# Patient Record
Sex: Female | Born: 1985 | Race: White | Hispanic: Yes | Marital: Married | Smoking: Former smoker
Health system: Southern US, Community
[De-identification: ages and names within clinical notes are randomized; demographics above are authoritative.]

## PROBLEM LIST (undated history)

## (undated) DIAGNOSIS — Z89511 Acquired absence of right leg below knee: Secondary | ICD-10-CM

## (undated) DIAGNOSIS — M869 Osteomyelitis, unspecified: Secondary | ICD-10-CM

## (undated) DIAGNOSIS — D509 Iron deficiency anemia, unspecified: Secondary | ICD-10-CM

## (undated) DIAGNOSIS — Z89512 Acquired absence of left leg below knee: Secondary | ICD-10-CM

## (undated) DIAGNOSIS — Z9889 Other specified postprocedural states: Secondary | ICD-10-CM

## (undated) DIAGNOSIS — E538 Deficiency of other specified B group vitamins: Secondary | ICD-10-CM

## (undated) DIAGNOSIS — Z899 Acquired absence of limb, unspecified: Secondary | ICD-10-CM

## (undated) HISTORY — PX: FREE FLAP GRAFT: SHX1677

## (undated) HISTORY — PX: OTHER SURGICAL HISTORY: SHX169

## (undated) HISTORY — PX: BACK SURGERY: SHX140

---

## 2012-10-22 DIAGNOSIS — B999 Unspecified infectious disease: Secondary | ICD-10-CM | POA: Insufficient documentation

## 2014-07-16 DIAGNOSIS — A4901 Methicillin susceptible Staphylococcus aureus infection, unspecified site: Secondary | ICD-10-CM | POA: Insufficient documentation

## 2014-07-20 DIAGNOSIS — R2689 Other abnormalities of gait and mobility: Secondary | ICD-10-CM | POA: Insufficient documentation

## 2014-09-29 DIAGNOSIS — G8921 Chronic pain due to trauma: Secondary | ICD-10-CM | POA: Insufficient documentation

## 2015-11-02 DIAGNOSIS — F329 Major depressive disorder, single episode, unspecified: Secondary | ICD-10-CM | POA: Insufficient documentation

## 2015-11-02 DIAGNOSIS — G8220 Paraplegia, unspecified: Secondary | ICD-10-CM | POA: Insufficient documentation

## 2015-11-02 DIAGNOSIS — F419 Anxiety disorder, unspecified: Secondary | ICD-10-CM | POA: Insufficient documentation

## 2015-11-02 DIAGNOSIS — F32A Depression, unspecified: Secondary | ICD-10-CM | POA: Insufficient documentation

## 2016-04-17 DIAGNOSIS — Z8739 Personal history of other diseases of the musculoskeletal system and connective tissue: Secondary | ICD-10-CM | POA: Insufficient documentation

## 2016-05-10 DIAGNOSIS — N319 Neuromuscular dysfunction of bladder, unspecified: Secondary | ICD-10-CM | POA: Insufficient documentation

## 2016-08-02 ENCOUNTER — Emergency Department (HOSPITAL_COMMUNITY): Payer: Medicare Other

## 2016-08-02 ENCOUNTER — Inpatient Hospital Stay (HOSPITAL_COMMUNITY)
Admission: EM | Admit: 2016-08-02 | Discharge: 2016-08-05 | DRG: 539 | Disposition: A | Discharge status: Home Health | Payer: Medicare Other | Attending: Family Medicine | Admitting: Family Medicine

## 2016-08-02 ENCOUNTER — Encounter (HOSPITAL_COMMUNITY): Payer: Self-pay

## 2016-08-02 ENCOUNTER — Inpatient Hospital Stay (HOSPITAL_COMMUNITY): Payer: Medicare Other

## 2016-08-02 DIAGNOSIS — M8610 Other acute osteomyelitis, unspecified site: Secondary | ICD-10-CM | POA: Diagnosis not present

## 2016-08-02 DIAGNOSIS — D500 Iron deficiency anemia secondary to blood loss (chronic): Secondary | ICD-10-CM | POA: Diagnosis present

## 2016-08-02 DIAGNOSIS — Z888 Allergy status to other drugs, medicaments and biological substances status: Secondary | ICD-10-CM

## 2016-08-02 DIAGNOSIS — L89220 Pressure ulcer of left hip, unstageable: Secondary | ICD-10-CM | POA: Diagnosis not present

## 2016-08-02 DIAGNOSIS — L89224 Pressure ulcer of left hip, stage 4: Secondary | ICD-10-CM | POA: Diagnosis present

## 2016-08-02 DIAGNOSIS — N390 Urinary tract infection, site not specified: Secondary | ICD-10-CM | POA: Diagnosis present

## 2016-08-02 DIAGNOSIS — Z886 Allergy status to analgesic agent status: Secondary | ICD-10-CM | POA: Diagnosis not present

## 2016-08-02 DIAGNOSIS — L89324 Pressure ulcer of left buttock, stage 4: Secondary | ICD-10-CM | POA: Diagnosis present

## 2016-08-02 DIAGNOSIS — Z89512 Acquired absence of left leg below knee: Secondary | ICD-10-CM

## 2016-08-02 DIAGNOSIS — E860 Dehydration: Secondary | ICD-10-CM

## 2016-08-02 DIAGNOSIS — Z833 Family history of diabetes mellitus: Secondary | ICD-10-CM | POA: Diagnosis not present

## 2016-08-02 DIAGNOSIS — R000 Tachycardia, unspecified: Secondary | ICD-10-CM

## 2016-08-02 DIAGNOSIS — Z9889 Other specified postprocedural states: Secondary | ICD-10-CM | POA: Diagnosis not present

## 2016-08-02 DIAGNOSIS — M86652 Other chronic osteomyelitis, left thigh: Secondary | ICD-10-CM | POA: Diagnosis present

## 2016-08-02 DIAGNOSIS — M868X8 Other osteomyelitis, other site: Secondary | ICD-10-CM | POA: Diagnosis not present

## 2016-08-02 DIAGNOSIS — N319 Neuromuscular dysfunction of bladder, unspecified: Secondary | ICD-10-CM | POA: Diagnosis present

## 2016-08-02 DIAGNOSIS — G8220 Paraplegia, unspecified: Secondary | ICD-10-CM | POA: Diagnosis present

## 2016-08-02 DIAGNOSIS — L8990 Pressure ulcer of unspecified site, unspecified stage: Secondary | ICD-10-CM | POA: Insufficient documentation

## 2016-08-02 DIAGNOSIS — M869 Osteomyelitis, unspecified: Secondary | ICD-10-CM

## 2016-08-02 DIAGNOSIS — Z87891 Personal history of nicotine dependence: Secondary | ICD-10-CM | POA: Diagnosis not present

## 2016-08-02 DIAGNOSIS — Z880 Allergy status to penicillin: Secondary | ICD-10-CM

## 2016-08-02 DIAGNOSIS — R112 Nausea with vomiting, unspecified: Secondary | ICD-10-CM

## 2016-08-02 DIAGNOSIS — L89894 Pressure ulcer of other site, stage 4: Secondary | ICD-10-CM | POA: Diagnosis not present

## 2016-08-02 DIAGNOSIS — Z89511 Acquired absence of right leg below knee: Secondary | ICD-10-CM | POA: Diagnosis not present

## 2016-08-02 DIAGNOSIS — M8668 Other chronic osteomyelitis, other site: Secondary | ICD-10-CM | POA: Diagnosis present

## 2016-08-02 DIAGNOSIS — L8944 Pressure ulcer of contiguous site of back, buttock and hip, stage 4: Secondary | ICD-10-CM

## 2016-08-02 DIAGNOSIS — M8648 Chronic osteomyelitis with draining sinus, other site: Secondary | ICD-10-CM | POA: Diagnosis not present

## 2016-08-02 HISTORY — DX: Other specified postprocedural states: Z98.890

## 2016-08-02 HISTORY — DX: Acquired absence of limb, unspecified: Z89.9

## 2016-08-02 HISTORY — DX: Osteomyelitis, unspecified: M86.9

## 2016-08-02 LAB — PREGNANCY, URINE: Preg Test, Ur: NEGATIVE

## 2016-08-02 LAB — I-STAT CG4 LACTIC ACID, ED
Lactic Acid, Venous: 1.05 mmol/L (ref 0.5–1.9)
Lactic Acid, Venous: 1.11 mmol/L (ref 0.5–1.9)

## 2016-08-02 LAB — CBC WITH DIFFERENTIAL/PLATELET
BASOS ABS: 0 10*3/uL (ref 0.0–0.1)
BASOS PCT: 0 %
EOS ABS: 0.1 10*3/uL (ref 0.0–0.7)
Eosinophils Relative: 1 %
HEMATOCRIT: 32.1 % — AB (ref 36.0–46.0)
Hemoglobin: 9.5 g/dL — ABNORMAL LOW (ref 12.0–15.0)
Lymphocytes Relative: 29 %
Lymphs Abs: 3 10*3/uL (ref 0.7–4.0)
MCH: 22.5 pg — ABNORMAL LOW (ref 26.0–34.0)
MCHC: 29.6 g/dL — ABNORMAL LOW (ref 30.0–36.0)
MCV: 75.9 fL — ABNORMAL LOW (ref 78.0–100.0)
MONO ABS: 0.9 10*3/uL (ref 0.1–1.0)
Monocytes Relative: 9 %
NEUTROS ABS: 6.4 10*3/uL (ref 1.7–7.7)
Neutrophils Relative %: 61 %
PLATELETS: 530 10*3/uL — AB (ref 150–400)
RBC: 4.23 MIL/uL (ref 3.87–5.11)
RDW: 19.5 % — AB (ref 11.5–15.5)
WBC: 10.4 10*3/uL (ref 4.0–10.5)

## 2016-08-02 LAB — URINALYSIS, ROUTINE W REFLEX MICROSCOPIC
BILIRUBIN URINE: NEGATIVE
GLUCOSE, UA: NEGATIVE mg/dL
Ketones, ur: 80 mg/dL — AB
NITRITE: POSITIVE — AB
PH: 7 (ref 5.0–8.0)
Protein, ur: 30 mg/dL — AB
SPECIFIC GRAVITY, URINE: 1.017 (ref 1.005–1.030)

## 2016-08-02 LAB — COMPREHENSIVE METABOLIC PANEL
ALK PHOS: 82 U/L (ref 38–126)
ALT: 8 U/L — ABNORMAL LOW (ref 14–54)
ANION GAP: 12 (ref 5–15)
AST: 11 U/L — ABNORMAL LOW (ref 15–41)
Albumin: 3.4 g/dL — ABNORMAL LOW (ref 3.5–5.0)
BILIRUBIN TOTAL: 0.6 mg/dL (ref 0.3–1.2)
BUN: 7 mg/dL (ref 6–20)
CALCIUM: 8.8 mg/dL — AB (ref 8.9–10.3)
CO2: 21 mmol/L — ABNORMAL LOW (ref 22–32)
Chloride: 101 mmol/L (ref 101–111)
Creatinine, Ser: <0.3 mg/dL — ABNORMAL LOW (ref 0.44–1.00)
GLUCOSE: 80 mg/dL (ref 65–99)
Potassium: 3.6 mmol/L (ref 3.5–5.1)
Sodium: 134 mmol/L — ABNORMAL LOW (ref 135–145)
Total Protein: 8.2 g/dL — ABNORMAL HIGH (ref 6.5–8.1)

## 2016-08-02 MED ORDER — MORPHINE SULFATE (PF) 4 MG/ML IV SOLN
4.0000 mg | Freq: Once | INTRAVENOUS | Status: AC
  Administered 2016-08-02: 4 mg via INTRAVENOUS
  Filled 2016-08-02: qty 1

## 2016-08-02 MED ORDER — SODIUM CHLORIDE 0.9% FLUSH: 10.0000 mL | INTRAVENOUS | Status: DC | PRN

## 2016-08-02 MED ORDER — VANCOMYCIN HCL IN DEXTROSE 1-5 GM/200ML-% IV SOLN
1000.0000 mg | Freq: Once | INTRAVENOUS | Status: AC
  Administered 2016-08-02: 1000 mg via INTRAVENOUS
  Filled 2016-08-02: qty 200

## 2016-08-02 MED ORDER — SODIUM CHLORIDE 0.9 % IV BOLUS (SEPSIS)
1000.0000 mL | Freq: Once | INTRAVENOUS | Status: AC
  Administered 2016-08-02: 1000 mL via INTRAVENOUS

## 2016-08-02 MED ORDER — ONDANSETRON HCL 4 MG/2ML IJ SOLN
4.0000 mg | Freq: Once | INTRAMUSCULAR | Status: AC
  Administered 2016-08-02: 4 mg via INTRAVENOUS
  Filled 2016-08-02: qty 2

## 2016-08-02 MED ORDER — ACETAMINOPHEN 325 MG PO TABS: 650.0000 mg | ORAL_TABLET | Freq: Four times a day (QID) | ORAL | Status: DC | PRN

## 2016-08-02 MED ORDER — ACETAMINOPHEN 650 MG RE SUPP: 650.0000 mg | Freq: Four times a day (QID) | RECTAL | Status: DC | PRN

## 2016-08-02 MED ORDER — SODIUM CHLORIDE 0.9% FLUSH
10.0000 mL | Freq: Two times a day (BID) | INTRAVENOUS | Status: DC
  Administered 2016-08-02 – 2016-08-05 (×3): 10 mL

## 2016-08-02 MED ORDER — MORPHINE SULFATE (PF) 2 MG/ML IV SOLN
2.0000 mg | INTRAVENOUS | Status: DC | PRN
  Administered 2016-08-02 – 2016-08-05 (×12): 2 mg via INTRAVENOUS
  Filled 2016-08-02 (×13): qty 1

## 2016-08-02 MED ORDER — HYDROMORPHONE HCL 1 MG/ML IJ SOLN
1.0000 mg | Freq: Once | INTRAMUSCULAR | Status: AC
  Administered 2016-08-02: 1 mg via INTRAVENOUS
  Filled 2016-08-02: qty 1

## 2016-08-02 MED ORDER — ONDANSETRON HCL 4 MG/2ML IJ SOLN
4.0000 mg | Freq: Four times a day (QID) | INTRAMUSCULAR | Status: DC | PRN
  Administered 2016-08-02 – 2016-08-03 (×2): 4 mg via INTRAVENOUS
  Filled 2016-08-02 (×2): qty 2

## 2016-08-02 MED ORDER — HEPARIN SODIUM (PORCINE) 5000 UNIT/ML IJ SOLN
5000.0000 [IU] | Freq: Three times a day (TID) | INTRAMUSCULAR | Status: DC
  Administered 2016-08-02 – 2016-08-04 (×5): 5000 [IU] via SUBCUTANEOUS
  Filled 2016-08-02 (×7): qty 1

## 2016-08-02 MED ORDER — MORPHINE SULFATE (PF) 2 MG/ML IV SOLN: 1.0000 mg | INTRAVENOUS | Status: DC | PRN

## 2016-08-02 MED ORDER — DEXTROSE 5 % IV SOLN
1.0000 g | Freq: Once | INTRAVENOUS | Status: AC
  Administered 2016-08-02: 1 g via INTRAVENOUS
  Filled 2016-08-02: qty 10

## 2016-08-02 MED ORDER — DEXTROSE 5 % IV SOLN
2.0000 g | INTRAVENOUS | Status: DC
  Administered 2016-08-03 – 2016-08-05 (×3): 2 g via INTRAVENOUS
  Filled 2016-08-02 (×3): qty 2

## 2016-08-02 NOTE — Progress Notes (Signed)
pcp is [facility] [address] [place] [phone]

## 2016-08-02 NOTE — ED Notes (Signed)
Call to 5E, await for them to be ready for bed
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
## 2016-08-02 NOTE — ED Notes (Signed)
IV team at the bedside. 

## 2016-08-02 NOTE — ED Triage Notes (Signed)
Pt recently moved here. Does not have a primary MD.  Has infected decub wound.  Fever.  Odor.   Pt also bil amputee
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
## 2016-08-02 NOTE — ED Notes (Signed)
Pt self caths for urine, will give her supplies once fluid bolus is in as pt feels dehydrated
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
## 2016-08-02 NOTE — Progress Notes (Signed)
Pharmacy Antibiotic Note  Kathryn Ewing is a 30 y.o. female admitted on 08/02/2016 with osteomyelitis.  Pharmacy has been consulted for Rocephin dosing.  Plan: 1) Rocephin 2g IV daily 2) Will continue to monitor with you  Weight: 145 lb (65.8 kg)  Temp (24hrs), Avg:98.5 F (36.9 C), Min:98.2 F (36.8 C), Max:99.1 F (37.3 C)   Recent Labs Lab 08/02/16 1143 08/02/16 1154 08/02/16 1452  WBC 10.4  --   --   CREATININE <0.30*  --   --   LATICACIDVEN  --  1.05 1.11    CrCl cannot be calculated (This lab value cannot be used to calculate CrCl because it is not a number: <0.30).    Allergies  Allergen Reactions  . Amoxicillin Nausea And Vomiting and Other (See Comments)    Has patient had a PCN reaction causing immediate rash, facial/tongue/throat swelling, SOB or lightheadedness with hypotension: No Has patient had a PCN reaction causing severe rash involving mucus membranes or skin necrosis: No Has patient had a PCN reaction that required hospitalization No Has patient had a PCN reaction occurring within the last 10 years: Yes If all of the above answers are "NO", then may proceed with Cephalosporin use.  . Codeine Nausea And Vomiting  . Hydrocodone Nausea And Vomiting  . Meropenem Nausea And Vomiting  . Metronidazole Nausea And Vomiting  . Oxycodone-Acetaminophen Itching  . Piperacillin Sod-Tazobactam So Nausea And Vomiting  . Promethazine Nausea And Vomiting  . Diazepam Rash     Thank you for allowing pharmacy to be a part of this patient's care.   Hessie KnowsJustin M Makhai Fulco, PharmD, BCPS Pager [phone] 08/02/2016 6:13 PM

## 2016-08-02 NOTE — ED Notes (Signed)
ABD pad placed on bed sore

## 2016-08-03 ENCOUNTER — Inpatient Hospital Stay (HOSPITAL_COMMUNITY): Payer: Medicare Other

## 2016-08-03 DIAGNOSIS — Z888 Allergy status to other drugs, medicaments and biological substances status: Secondary | ICD-10-CM

## 2016-08-03 DIAGNOSIS — M868X8 Other osteomyelitis, other site: Secondary | ICD-10-CM

## 2016-08-03 DIAGNOSIS — L89894 Pressure ulcer of other site, stage 4: Secondary | ICD-10-CM

## 2016-08-03 DIAGNOSIS — Z79899 Other long term (current) drug therapy: Secondary | ICD-10-CM

## 2016-08-03 DIAGNOSIS — N390 Urinary tract infection, site not specified: Secondary | ICD-10-CM

## 2016-08-03 DIAGNOSIS — L89220 Pressure ulcer of left hip, unstageable: Secondary | ICD-10-CM

## 2016-08-03 DIAGNOSIS — M8648 Chronic osteomyelitis with draining sinus, other site: Secondary | ICD-10-CM

## 2016-08-03 DIAGNOSIS — R000 Tachycardia, unspecified: Secondary | ICD-10-CM

## 2016-08-03 DIAGNOSIS — Z87891 Personal history of nicotine dependence: Secondary | ICD-10-CM

## 2016-08-03 LAB — CBC
HCT: 28 % — ABNORMAL LOW (ref 36.0–46.0)
Hemoglobin: 8.2 g/dL — ABNORMAL LOW (ref 12.0–15.0)
MCH: 22.6 pg — ABNORMAL LOW (ref 26.0–34.0)
MCHC: 29.3 g/dL — ABNORMAL LOW (ref 30.0–36.0)
MCV: 77.1 fL — ABNORMAL LOW (ref 78.0–100.0)
PLATELETS: 459 10*3/uL — AB (ref 150–400)
RBC: 3.63 MIL/uL — AB (ref 3.87–5.11)
RDW: 19.4 % — AB (ref 11.5–15.5)
WBC: 8 10*3/uL (ref 4.0–10.5)

## 2016-08-03 LAB — BASIC METABOLIC PANEL
ANION GAP: 10 (ref 5–15)
CALCIUM: 8.1 mg/dL — AB (ref 8.9–10.3)
CO2: 22 mmol/L (ref 22–32)
Chloride: 103 mmol/L (ref 101–111)
Creatinine, Ser: 0.34 mg/dL — ABNORMAL LOW (ref 0.44–1.00)
GFR calc Af Amer: >60 mL/min (ref >60)
Glucose, Bld: 83 mg/dL (ref 65–99)
POTASSIUM: 3.2 mmol/L — AB (ref 3.5–5.1)
SODIUM: 135 mmol/L (ref 135–145)

## 2016-08-03 LAB — C-REACTIVE PROTEIN: CRP: 18.7 mg/dL — AB (ref <1.0)

## 2016-08-03 MED ORDER — SODIUM CHLORIDE 0.9 % IV SOLN
30.0000 meq | Freq: Once | INTRAVENOUS | Status: AC
  Administered 2016-08-03: 30 meq via INTRAVENOUS
  Filled 2016-08-03: qty 15

## 2016-08-03 MED ORDER — POTASSIUM CHLORIDE CRYS ER 20 MEQ PO TBCR
40.0000 meq | EXTENDED_RELEASE_TABLET | Freq: Once | ORAL | Status: DC
  Filled 2016-08-03: qty 2

## 2016-08-03 MED ORDER — ALTEPLASE 2 MG IJ SOLR
2.0000 mg | Freq: Once | INTRAMUSCULAR | Status: DC
Start: 2016-08-03 — End: 2016-08-05
  Filled 2016-08-03: qty 2

## 2016-08-03 MED ORDER — DIPHENHYDRAMINE HCL 25 MG PO CAPS
ORAL_CAPSULE | ORAL | Status: AC
  Filled 2016-08-03: qty 1

## 2016-08-03 MED ORDER — LORAZEPAM 0.5 MG PO TABS
0.5000 mg | ORAL_TABLET | Freq: Once | ORAL | Status: AC
  Administered 2016-08-03: 0.5 mg via ORAL
  Filled 2016-08-03: qty 1

## 2016-08-03 MED ORDER — ALTEPLASE 2 MG IJ SOLR
2.0000 mg | Freq: Once | INTRAMUSCULAR | Status: AC
  Administered 2016-08-03: 2 mg
  Filled 2016-08-03: qty 2

## 2016-08-03 MED ORDER — DIPHENHYDRAMINE HCL 25 MG PO CAPS: 25.0000 mg | ORAL_CAPSULE | Freq: Four times a day (QID) | ORAL | Status: DC | PRN

## 2016-08-03 NOTE — Progress Notes (Addendum)
Portable equipment called to order low air loss mattress.  Right now all beds are currently in use.  Will check back at a later time to see if any are available.
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
## 2016-08-03 NOTE — Consult Note (Signed)
WOC Nurse wound consult note Reason for Consult:Osteomyelitis and nonhealing, stage 4 pressure injury to left ischial tuberosity.   Wound type:Stage 4 pressure injury Pressure Ulcer POA: Yes Measurement: 4.5 cm x 4 cm x 3 cm with undermining circumferentially, extending 1.4 cm.  Musty odor and minimal purulent drainage.   Wound BJY:NWGNbed:pale pink, nongranulating.  Drainage (amount, consistency, odor) Musty odor.  Minimal purulence.  Periwound:Itnact Dressing procedure/placement/frequency:Will order mattress with low air loss feature.   Cleanse left ischial wound with NS and pat gently dry.  Gently fill wound bed (including undermining with AquaceL Ag.  Fill remaining depth with [place] moist woven gauze.  Cover with 4x4 gauze and ABD pad.  Change daily.  Will not follow at this time.  Please re-consult if needed.  Maple HudsonKaren Tashe Purdon RN BSN CWON Pager [phone]

## 2016-08-04 DIAGNOSIS — M8610 Other acute osteomyelitis, unspecified site: Secondary | ICD-10-CM

## 2016-08-04 LAB — CBC
HEMATOCRIT: 25.2 % — AB (ref 36.0–46.0)
Hemoglobin: 7.4 g/dL — ABNORMAL LOW (ref 12.0–15.0)
MCH: 22.7 pg — AB (ref 26.0–34.0)
MCHC: 29.4 g/dL — ABNORMAL LOW (ref 30.0–36.0)
MCV: 77.3 fL — AB (ref 78.0–100.0)
PLATELETS: 412 10*3/uL — AB (ref 150–400)
RBC: 3.26 MIL/uL — ABNORMAL LOW (ref 3.87–5.11)
RDW: 19.3 % — AB (ref 11.5–15.5)
WBC: 6.1 10*3/uL (ref 4.0–10.5)

## 2016-08-04 LAB — BASIC METABOLIC PANEL
ANION GAP: 5 (ref 5–15)
CO2: 25 mmol/L (ref 22–32)
Calcium: 8.1 mg/dL — ABNORMAL LOW (ref 8.9–10.3)
Chloride: 107 mmol/L (ref 101–111)
Creatinine, Ser: 0.33 mg/dL — ABNORMAL LOW (ref 0.44–1.00)
GFR calc Af Amer: >60 mL/min (ref >60)
GFR calc non Af Amer: >60 mL/min (ref >60)
GLUCOSE: 92 mg/dL (ref 65–99)
POTASSIUM: 3.8 mmol/L (ref 3.5–5.1)
Sodium: 137 mmol/L (ref 135–145)

## 2016-08-04 LAB — HIV ANTIBODY (ROUTINE TESTING W REFLEX): HIV SCREEN 4TH GENERATION: NONREACTIVE

## 2016-08-04 LAB — SEDIMENTATION RATE: Sed Rate: 92 mm/hr — ABNORMAL HIGH (ref 0–22)

## 2016-08-04 LAB — PREPARE RBC (CROSSMATCH)

## 2016-08-04 LAB — MAGNESIUM: MAGNESIUM: 2.1 mg/dL (ref 1.7–2.4)

## 2016-08-04 LAB — ABO/RH: ABO/RH(D): A POS

## 2016-08-04 MED ORDER — SODIUM CHLORIDE 0.9 % IV SOLN: Freq: Once | INTRAVENOUS | Status: DC
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
## 2016-08-05 LAB — CBC
HEMATOCRIT: 34.1 % — AB (ref 36.0–46.0)
Hemoglobin: 10.3 g/dL — ABNORMAL LOW (ref 12.0–15.0)
MCH: 24.8 pg — ABNORMAL LOW (ref 26.0–34.0)
MCHC: 30.2 g/dL (ref 30.0–36.0)
MCV: 82.2 fL (ref 78.0–100.0)
Platelets: 233 10*3/uL (ref 150–400)
RBC: 4.15 MIL/uL (ref 3.87–5.11)
RDW: 19.5 % — ABNORMAL HIGH (ref 11.5–15.5)
WBC: 5.4 10*3/uL (ref 4.0–10.5)

## 2016-08-05 LAB — URINE CULTURE

## 2016-08-05 MED ORDER — CIPROFLOXACIN HCL 500 MG PO TABS: 500.0000 mg | ORAL_TABLET | Freq: Two times a day (BID) | ORAL | 0 refills | Status: DC

## 2016-08-05 MED ORDER — CEFAZOLIN SODIUM 1 G IV SOLR: 1.0000 g | Freq: Three times a day (TID) | INTRAVENOUS | 0 refills | Status: AC

## 2016-08-05 NOTE — Care Management Important Message (Signed)
Important Message  Patient Details  Name: Kathryn Ewing MRN: [id number] Date of Birth: 12/17/1985   Medicare Important Message Given:  Yes    Elliot CousinShavis, Ratasha Fabre Ellen, RN 08/05/2016, 4:21 PM

## 2016-08-05 NOTE — Progress Notes (Signed)
This shift pt has refused a.m labs, vitals and heparin SQ. Rescheduled for after 8 am at pt request. This information to be reported to hospitalist and  oncoming RN.

## 2016-08-05 NOTE — Progress Notes (Signed)
Refused DBIV labs.

## 2016-08-05 NOTE — Care Management Note (Signed)
Case Management Note  Patient Details  Name: Kathryn Ewing MRN: [id number] Date of Birth: 05/06/1986  Subjective/Objective:    Chronic Osteomyelitis Left Ischium                Action/Plan: Discharge Planning: AVS reviewed:    NCM spoke to pt and [facility] the [facility]. [facility] Pt scheduled dc home today with IV abx. Pt has wheelchair at home. Lives with husband.     Expected Discharge Date:  08/05/2016               Expected Discharge Plan:  Home w Home Health Services  In-House Referral:  NA  Discharge planning Services  CM Consult  Post Acute Care Choice:  Home Health Choice offered to:  Patient  DME Arranged:  N/A DME Agency:  NA  HH Arranged:  RN, IV Antibiotics HH Agency:  [facility]  Status of Service:     If discussed at Long Length of Stay Meetings, dates discussed:    Additional Comments:  Elliot CousinShavis, Joncarlo Friberg Ellen, RN 08/05/2016, 4:10 PM
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
## 2016-08-06 LAB — TYPE AND SCREEN
ABO/RH(D): A POS
Antibody Screen: POSITIVE
DAT, IgG: NEGATIVE
DONOR AG TYPE: NEGATIVE
Donor AG Type: NEGATIVE
PT AG TYPE: NEGATIVE
UNIT DIVISION: 0
Unit division: 0

## 2016-08-06 LAB — AEROBIC CULTURE  (SUPERFICIAL SPECIMEN)

## 2016-08-06 LAB — AEROBIC CULTURE W GRAM STAIN (SUPERFICIAL SPECIMEN)

## 2016-08-07 ENCOUNTER — Ambulatory Visit: Payer: Medicare Other | Admitting: Internal Medicine

## 2016-08-07 LAB — CULTURE, BLOOD (ROUTINE X 2)
Culture: NO GROWTH
Culture: NO GROWTH

## 2016-08-16 ENCOUNTER — Ambulatory Visit: Payer: Medicare Other | Admitting: Internal Medicine

## 2016-08-16 ENCOUNTER — Telehealth: Payer: Self-pay | Admitting: *Deleted

## 2016-08-16 NOTE — Telephone Encounter (Signed)
Verbal [facility] Dr Luc[facility]omer [facility] for [facility] date of 12/24 and pull picc after completion of therapy. Order repeated and verified.  Dr Luciana Axeomer would like to know how compliant patient was with IV therapy. RN left message for Nursing manager Myrtie NeitherHeather Ford. Andree CossHowell, Lexis Potenza M, RN

## 2016-08-16 NOTE — Telephone Encounter (Signed)
Leonie DouglasBarbara Brooks, [facility] RN returned call.  RN unable to draw blood from picc, patient refused peripheral stick. Patient very rude to RN, would not allow her to assess her wounds, do a medication count to assess compliance.  [phone]

## 2016-08-20 ENCOUNTER — Emergency Department (HOSPITAL_COMMUNITY): Payer: Medicare Other

## 2016-08-20 ENCOUNTER — Emergency Department (HOSPITAL_COMMUNITY)
Admission: EM | Admit: 2016-08-20 | Discharge: 2016-08-21 | Disposition: A | Discharge status: Home / Self Care | Payer: Medicare Other | Attending: Emergency Medicine | Admitting: Emergency Medicine

## 2016-08-20 ENCOUNTER — Encounter (HOSPITAL_COMMUNITY): Payer: Self-pay | Admitting: Emergency Medicine

## 2016-08-20 DIAGNOSIS — Z87891 Personal history of nicotine dependence: Secondary | ICD-10-CM | POA: Insufficient documentation

## 2016-08-20 DIAGNOSIS — N83519 Torsion of ovary and ovarian pedicle, unspecified side: Secondary | ICD-10-CM | POA: Insufficient documentation

## 2016-08-20 DIAGNOSIS — R1031 Right lower quadrant pain: Secondary | ICD-10-CM | POA: Diagnosis present

## 2016-08-20 DIAGNOSIS — R102 Pelvic and perineal pain: Secondary | ICD-10-CM

## 2016-08-20 LAB — I-STAT BETA HCG BLOOD, ED (MC, WL, AP ONLY): I-stat hCG, quantitative: <5 m[IU]/mL (ref <5)

## 2016-08-20 LAB — CBC WITH DIFFERENTIAL/PLATELET
BASOS ABS: 0 10*3/uL (ref 0.0–0.1)
BASOS PCT: 0 %
EOS PCT: 2 %
Eosinophils Absolute: 0.2 10*3/uL (ref 0.0–0.7)
HCT: 34.3 % — ABNORMAL LOW (ref 36.0–46.0)
Hemoglobin: 10.4 g/dL — ABNORMAL LOW (ref 12.0–15.0)
Lymphocytes Relative: 28 %
Lymphs Abs: 2.8 10*3/uL (ref 0.7–4.0)
MCH: 24.2 pg — ABNORMAL LOW (ref 26.0–34.0)
MCHC: 30.3 g/dL (ref 30.0–36.0)
MCV: 80 fL (ref 78.0–100.0)
MONO ABS: 0.8 10*3/uL (ref 0.1–1.0)
Monocytes Relative: 8 %
Neutro Abs: 6.2 10*3/uL (ref 1.7–7.7)
Neutrophils Relative %: 62 %
PLATELETS: 435 10*3/uL — AB (ref 150–400)
RBC: 4.29 MIL/uL (ref 3.87–5.11)
RDW: 19.7 % — AB (ref 11.5–15.5)
WBC: 10 10*3/uL (ref 4.0–10.5)

## 2016-08-20 LAB — BASIC METABOLIC PANEL
Anion gap: 9 (ref 5–15)
BUN: 9 mg/dL (ref 6–20)
CALCIUM: 8.5 mg/dL — AB (ref 8.9–10.3)
CO2: 24 mmol/L (ref 22–32)
Chloride: 105 mmol/L (ref 101–111)
Creatinine, Ser: 0.3 mg/dL — ABNORMAL LOW (ref 0.44–1.00)
GFR calc Af Amer: >60 mL/min (ref >60)
GLUCOSE: 103 mg/dL — AB (ref 65–99)
Potassium: 3.4 mmol/L — ABNORMAL LOW (ref 3.5–5.1)
Sodium: 138 mmol/L (ref 135–145)

## 2016-08-20 LAB — URINALYSIS, ROUTINE W REFLEX MICROSCOPIC
Bilirubin Urine: NEGATIVE
GLUCOSE, UA: NEGATIVE mg/dL
HGB URINE DIPSTICK: NEGATIVE
Ketones, ur: NEGATIVE mg/dL
LEUKOCYTES UA: NEGATIVE
Nitrite: NEGATIVE
PH: 6 (ref 5.0–8.0)
PROTEIN: NEGATIVE mg/dL
SPECIFIC GRAVITY, URINE: 1.02 (ref 1.005–1.030)

## 2016-08-20 MED ORDER — FENTANYL CITRATE (PF) 100 MCG/2ML IJ SOLN
50.0000 ug | Freq: Once | INTRAMUSCULAR | Status: AC
  Administered 2016-08-20: 50 ug via INTRAVENOUS
  Filled 2016-08-20: qty 2

## 2016-08-20 MED ORDER — ONDANSETRON 4 MG PO TBDP: 4.0000 mg | ORAL_TABLET | Freq: Once | ORAL | Status: DC

## 2016-08-20 MED ORDER — FENTANYL CITRATE (PF) 100 MCG/2ML IJ SOLN
50.0000 ug | Freq: Once | INTRAMUSCULAR | Status: DC
  Filled 2016-08-20: qty 2

## 2016-08-20 MED ORDER — FENTANYL CITRATE (PF) 100 MCG/2ML IJ SOLN
50.0000 ug | Freq: Once | INTRAMUSCULAR | Status: AC
  Administered 2016-08-20: 50 ug via INTRAMUSCULAR

## 2016-08-20 MED ORDER — IOPAMIDOL (ISOVUE-300) INJECTION 61%
INTRAVENOUS | Status: AC
  Filled 2016-08-20: qty 100

## 2016-08-20 MED ORDER — IOPAMIDOL (ISOVUE-300) INJECTION 61%
INTRAVENOUS | Status: AC
  Administered 2016-08-20: 100 mL via INTRAVENOUS
  Filled 2016-08-20: qty 30

## 2016-08-20 MED ORDER — ONDANSETRON HCL 4 MG/2ML IJ SOLN
4.0000 mg | Freq: Once | INTRAMUSCULAR | Status: DC
  Filled 2016-08-20: qty 2
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
## 2016-08-20 NOTE — ED Notes (Signed)
US at bedside

## 2016-08-20 NOTE — ED Triage Notes (Signed)
Per EMS pt from home sudden onset lower right side abdominal pain. Pt is paraplegic with decubitus ulcer on bottom current receiving antibiotics through central line.

## 2016-08-20 NOTE — ED Notes (Signed)
Belfi, MD at bedside.  

## 2016-08-21 DIAGNOSIS — N83519 Torsion of ovary and ovarian pedicle, unspecified side: Secondary | ICD-10-CM | POA: Diagnosis not present

## 2016-08-21 LAB — GC/CHLAMYDIA PROBE AMP (~~LOC~~) NOT AT ARMC
Chlamydia: NEGATIVE
Neisseria Gonorrhea: NEGATIVE

## 2016-08-21 MED ORDER — TRAMADOL HCL 50 MG PO TABS: 50.0000 mg | ORAL_TABLET | Freq: Four times a day (QID) | ORAL | 0 refills | Status: DC | PRN

## 2016-08-21 MED ORDER — HEPARIN SOD (PORK) LOCK FLUSH 100 UNIT/ML IV SOLN
500.0000 [IU] | Freq: Once | INTRAVENOUS | Status: AC
  Administered 2016-08-21: 500 [IU]
  Filled 2016-08-21: qty 5

## 2016-08-22 ENCOUNTER — Telehealth: Payer: Self-pay | Admitting: *Deleted

## 2016-08-22 DIAGNOSIS — M869 Osteomyelitis, unspecified: Secondary | ICD-10-CM

## 2016-08-22 LAB — HIV ANTIBODY (ROUTINE TESTING W REFLEX): HIV Screen 4th Generation wRfx: NONREACTIVE

## 2016-08-22 LAB — RPR: RPR Ser Ql: NONREACTIVE

## 2016-08-22 NOTE — Telephone Encounter (Signed)
Received message 12/26 from Leonie DouglasBarbara Brooks, [facility].  Patient again refusing home health nurse visit.  [facility] may be discharging patient. Patient was to have PICC pulled 12/24, went to the ED 12/25 and was noted to be receiving IV antibiotics. This RN left Britta MccreedyBarbara a message regarding the PICC status.  [facility], spoke with Coretta. Per Coretta, will send nursing order again to make sure this PICC has been pulled.

## 2016-08-22 NOTE — Telephone Encounter (Signed)
Patient with central line, would prefer [facility] for IR removal.  Order placed for IR removal per verbal order from Dr Luciana Axeomer.  Spoke with [facility] IR scheduling to let them know.

## 2016-08-24 ENCOUNTER — Other Ambulatory Visit: Payer: Self-pay | Admitting: General Surgery

## 2016-08-28 ENCOUNTER — Inpatient Hospital Stay (HOSPITAL_COMMUNITY): Admission: RE | Admit: 2016-08-28 | Payer: Medicare Other | Source: Ambulatory Visit

## 2016-08-29 ENCOUNTER — Encounter (HOSPITAL_BASED_OUTPATIENT_CLINIC_OR_DEPARTMENT_OTHER): Payer: Medicare Other | Attending: Surgery

## 2016-09-03 ENCOUNTER — Inpatient Hospital Stay (HOSPITAL_COMMUNITY)
Admission: EM | Admit: 2016-09-03 | Discharge: 2016-09-09 | DRG: 539 | Disposition: A | Discharge status: Home Health | Payer: Medicare Other | Attending: Internal Medicine | Admitting: Internal Medicine

## 2016-09-03 ENCOUNTER — Encounter (HOSPITAL_COMMUNITY): Payer: Self-pay

## 2016-09-03 DIAGNOSIS — L89309 Pressure ulcer of unspecified buttock, unspecified stage: Secondary | ICD-10-CM | POA: Diagnosis present

## 2016-09-03 DIAGNOSIS — M8668 Other chronic osteomyelitis, other site: Secondary | ICD-10-CM | POA: Diagnosis present

## 2016-09-03 DIAGNOSIS — G8220 Paraplegia, unspecified: Secondary | ICD-10-CM | POA: Diagnosis present

## 2016-09-03 DIAGNOSIS — N390 Urinary tract infection, site not specified: Secondary | ICD-10-CM | POA: Diagnosis present

## 2016-09-03 DIAGNOSIS — M6088 Other myositis, other site: Secondary | ICD-10-CM | POA: Diagnosis present

## 2016-09-03 DIAGNOSIS — L89324 Pressure ulcer of left buttock, stage 4: Secondary | ICD-10-CM | POA: Diagnosis present

## 2016-09-03 DIAGNOSIS — Z885 Allergy status to narcotic agent status: Secondary | ICD-10-CM

## 2016-09-03 DIAGNOSIS — Z87891 Personal history of nicotine dependence: Secondary | ICD-10-CM

## 2016-09-03 DIAGNOSIS — M868X8 Other osteomyelitis, other site: Secondary | ICD-10-CM | POA: Diagnosis not present

## 2016-09-03 DIAGNOSIS — M8660 Other chronic osteomyelitis, unspecified site: Secondary | ICD-10-CM

## 2016-09-03 DIAGNOSIS — R8271 Bacteriuria: Secondary | ICD-10-CM | POA: Diagnosis present

## 2016-09-03 DIAGNOSIS — Z89512 Acquired absence of left leg below knee: Secondary | ICD-10-CM

## 2016-09-03 DIAGNOSIS — Z2239 Carrier of other specified bacterial diseases: Secondary | ICD-10-CM

## 2016-09-03 DIAGNOSIS — Z993 Dependence on wheelchair: Secondary | ICD-10-CM

## 2016-09-03 DIAGNOSIS — Z888 Allergy status to other drugs, medicaments and biological substances status: Secondary | ICD-10-CM

## 2016-09-03 DIAGNOSIS — D509 Iron deficiency anemia, unspecified: Secondary | ICD-10-CM | POA: Diagnosis present

## 2016-09-03 DIAGNOSIS — Z880 Allergy status to penicillin: Secondary | ICD-10-CM

## 2016-09-03 DIAGNOSIS — K5900 Constipation, unspecified: Secondary | ICD-10-CM | POA: Diagnosis present

## 2016-09-03 DIAGNOSIS — Z89511 Acquired absence of right leg below knee: Secondary | ICD-10-CM

## 2016-09-03 DIAGNOSIS — R000 Tachycardia, unspecified: Secondary | ICD-10-CM | POA: Diagnosis present

## 2016-09-03 DIAGNOSIS — M86652 Other chronic osteomyelitis, left thigh: Secondary | ICD-10-CM | POA: Diagnosis present

## 2016-09-03 DIAGNOSIS — L89320 Pressure ulcer of left buttock, unstageable: Secondary | ICD-10-CM

## 2016-09-03 DIAGNOSIS — Z789 Other specified health status: Secondary | ICD-10-CM

## 2016-09-03 DIAGNOSIS — M86152 Other acute osteomyelitis, left femur: Secondary | ICD-10-CM | POA: Diagnosis present

## 2016-09-03 DIAGNOSIS — A498 Other bacterial infections of unspecified site: Secondary | ICD-10-CM

## 2016-09-03 DIAGNOSIS — E538 Deficiency of other specified B group vitamins: Secondary | ICD-10-CM | POA: Diagnosis present

## 2016-09-03 HISTORY — DX: Acquired absence of right leg below knee: Z89.511

## 2016-09-03 HISTORY — DX: Iron deficiency anemia, unspecified: D50.9

## 2016-09-03 HISTORY — DX: Acquired absence of left leg below knee: Z89.512

## 2016-09-03 HISTORY — DX: Deficiency of other specified B group vitamins: E53.8

## 2016-09-03 MED ORDER — HYDROMORPHONE HCL 1 MG/ML IJ SOLN
1.0000 mg | Freq: Once | INTRAMUSCULAR | Status: AC
  Administered 2016-09-04: 1 mg via INTRAVENOUS
  Filled 2016-09-03: qty 1
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
## 2016-09-03 NOTE — ED Notes (Signed)
Made 2 attempts to draw labs, both were unsuccessful. Patient states that she usually has to have ultrasound iv and currently has picc line.
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
## 2016-09-04 ENCOUNTER — Observation Stay (HOSPITAL_COMMUNITY): Payer: Medicare Other

## 2016-09-04 ENCOUNTER — Encounter (HOSPITAL_COMMUNITY): Payer: Self-pay | Admitting: Internal Medicine

## 2016-09-04 DIAGNOSIS — M6088 Other myositis, other site: Secondary | ICD-10-CM | POA: Diagnosis present

## 2016-09-04 DIAGNOSIS — Z880 Allergy status to penicillin: Secondary | ICD-10-CM | POA: Diagnosis not present

## 2016-09-04 DIAGNOSIS — Z89512 Acquired absence of left leg below knee: Secondary | ICD-10-CM | POA: Diagnosis not present

## 2016-09-04 DIAGNOSIS — R8271 Bacteriuria: Secondary | ICD-10-CM | POA: Diagnosis present

## 2016-09-04 DIAGNOSIS — E538 Deficiency of other specified B group vitamins: Secondary | ICD-10-CM | POA: Diagnosis present

## 2016-09-04 DIAGNOSIS — G8220 Paraplegia, unspecified: Secondary | ICD-10-CM | POA: Diagnosis present

## 2016-09-04 DIAGNOSIS — Z888 Allergy status to other drugs, medicaments and biological substances status: Secondary | ICD-10-CM | POA: Diagnosis not present

## 2016-09-04 DIAGNOSIS — M8660 Other chronic osteomyelitis, unspecified site: Secondary | ICD-10-CM | POA: Diagnosis present

## 2016-09-04 DIAGNOSIS — N390 Urinary tract infection, site not specified: Secondary | ICD-10-CM | POA: Diagnosis present

## 2016-09-04 DIAGNOSIS — D509 Iron deficiency anemia, unspecified: Secondary | ICD-10-CM | POA: Diagnosis present

## 2016-09-04 DIAGNOSIS — L89309 Pressure ulcer of unspecified buttock, unspecified stage: Secondary | ICD-10-CM | POA: Diagnosis present

## 2016-09-04 DIAGNOSIS — L89324 Pressure ulcer of left buttock, stage 4: Secondary | ICD-10-CM | POA: Diagnosis present

## 2016-09-04 DIAGNOSIS — M8668 Other chronic osteomyelitis, other site: Secondary | ICD-10-CM | POA: Diagnosis not present

## 2016-09-04 DIAGNOSIS — Z89511 Acquired absence of right leg below knee: Secondary | ICD-10-CM | POA: Diagnosis not present

## 2016-09-04 DIAGNOSIS — B965 Pseudomonas (aeruginosa) (mallei) (pseudomallei) as the cause of diseases classified elsewhere: Secondary | ICD-10-CM | POA: Diagnosis not present

## 2016-09-04 DIAGNOSIS — Z885 Allergy status to narcotic agent status: Secondary | ICD-10-CM | POA: Diagnosis not present

## 2016-09-04 DIAGNOSIS — M86152 Other acute osteomyelitis, left femur: Secondary | ICD-10-CM | POA: Diagnosis present

## 2016-09-04 DIAGNOSIS — Z993 Dependence on wheelchair: Secondary | ICD-10-CM | POA: Diagnosis not present

## 2016-09-04 DIAGNOSIS — Z87891 Personal history of nicotine dependence: Secondary | ICD-10-CM | POA: Diagnosis not present

## 2016-09-04 DIAGNOSIS — M86652 Other chronic osteomyelitis, left thigh: Secondary | ICD-10-CM | POA: Diagnosis present

## 2016-09-04 DIAGNOSIS — K5900 Constipation, unspecified: Secondary | ICD-10-CM | POA: Diagnosis present

## 2016-09-04 DIAGNOSIS — R000 Tachycardia, unspecified: Secondary | ICD-10-CM | POA: Diagnosis present

## 2016-09-04 DIAGNOSIS — M868X8 Other osteomyelitis, other site: Secondary | ICD-10-CM | POA: Diagnosis present

## 2016-09-04 DIAGNOSIS — L89304 Pressure ulcer of unspecified buttock, stage 4: Secondary | ICD-10-CM | POA: Diagnosis not present

## 2016-09-04 DIAGNOSIS — Z2239 Carrier of other specified bacterial diseases: Secondary | ICD-10-CM | POA: Diagnosis not present

## 2016-09-04 LAB — COMPREHENSIVE METABOLIC PANEL
ALBUMIN: 3.2 g/dL — AB (ref 3.5–5.0)
ALK PHOS: 64 U/L (ref 38–126)
ALT: 6 U/L — ABNORMAL LOW (ref 14–54)
ALT: 8 U/L — ABNORMAL LOW (ref 14–54)
ANION GAP: 8 (ref 5–15)
AST: 11 U/L — ABNORMAL LOW (ref 15–41)
AST: 12 U/L — AB (ref 15–41)
Albumin: 3.1 g/dL — ABNORMAL LOW (ref 3.5–5.0)
Alkaline Phosphatase: 62 U/L (ref 38–126)
Anion gap: 7 (ref 5–15)
BILIRUBIN TOTAL: 0.4 mg/dL (ref 0.3–1.2)
BUN: 10 mg/dL (ref 6–20)
BUN: 11 mg/dL (ref 6–20)
CALCIUM: 8.4 mg/dL — AB (ref 8.9–10.3)
CHLORIDE: 106 mmol/L (ref 101–111)
CO2: 22 mmol/L (ref 22–32)
CO2: 25 mmol/L (ref 22–32)
CREATININE: 0.32 mg/dL — AB (ref 0.44–1.00)
Calcium: 8.4 mg/dL — ABNORMAL LOW (ref 8.9–10.3)
Chloride: 107 mmol/L (ref 101–111)
Creatinine, Ser: <0.3 mg/dL — ABNORMAL LOW (ref 0.44–1.00)
GFR calc Af Amer: >60 mL/min (ref >60)
GFR calc non Af Amer: >60 mL/min (ref >60)
GLUCOSE: 97 mg/dL (ref 65–99)
Glucose, Bld: 94 mg/dL (ref 65–99)
POTASSIUM: 3.5 mmol/L (ref 3.5–5.1)
Potassium: 3.4 mmol/L — ABNORMAL LOW (ref 3.5–5.1)
Sodium: 137 mmol/L (ref 135–145)
Sodium: 138 mmol/L (ref 135–145)
TOTAL PROTEIN: 8.1 g/dL (ref 6.5–8.1)
Total Bilirubin: 0.3 mg/dL (ref 0.3–1.2)
Total Protein: 7.7 g/dL (ref 6.5–8.1)

## 2016-09-04 LAB — CBC WITH DIFFERENTIAL/PLATELET
BASOS ABS: 0 10*3/uL (ref 0.0–0.1)
BASOS ABS: 0 10*3/uL (ref 0.0–0.1)
BASOS PCT: 0 %
Basophils Relative: 0 %
EOS ABS: 0.2 10*3/uL (ref 0.0–0.7)
EOS PCT: 4 %
EOS PCT: 2 %
Eosinophils Absolute: 0.3 10*3/uL (ref 0.0–0.7)
HCT: 31.4 % — ABNORMAL LOW (ref 36.0–46.0)
HEMATOCRIT: 32.9 % — AB (ref 36.0–46.0)
Hemoglobin: 10.2 g/dL — ABNORMAL LOW (ref 12.0–15.0)
Hemoglobin: 9.4 g/dL — ABNORMAL LOW (ref 12.0–15.0)
LYMPHS PCT: 40 %
Lymphocytes Relative: 25 %
Lymphs Abs: 2.7 10*3/uL (ref 0.7–4.0)
Lymphs Abs: 3.3 10*3/uL (ref 0.7–4.0)
MCH: 23.3 pg — AB (ref 26.0–34.0)
MCH: 23.9 pg — ABNORMAL LOW (ref 26.0–34.0)
MCHC: 29.9 g/dL — AB (ref 30.0–36.0)
MCHC: 31 g/dL (ref 30.0–36.0)
MCV: 77 fL — ABNORMAL LOW (ref 78.0–100.0)
MCV: 77.9 fL — AB (ref 78.0–100.0)
MONO ABS: 0.7 10*3/uL (ref 0.1–1.0)
MONO ABS: 0.9 10*3/uL (ref 0.1–1.0)
Monocytes Relative: 8 %
Monocytes Relative: 9 %
NEUTROS ABS: 6.9 10*3/uL (ref 1.7–7.7)
Neutro Abs: 3.9 10*3/uL (ref 1.7–7.7)
Neutrophils Relative %: 47 %
Neutrophils Relative %: 65 %
PLATELETS: 411 10*3/uL — AB (ref 150–400)
PLATELETS: 459 10*3/uL — AB (ref 150–400)
RBC: 4.03 MIL/uL (ref 3.87–5.11)
RBC: 4.27 MIL/uL (ref 3.87–5.11)
RDW: 18.1 % — AB (ref 11.5–15.5)
RDW: 18.4 % — ABNORMAL HIGH (ref 11.5–15.5)
WBC: 10.7 10*3/uL — ABNORMAL HIGH (ref 4.0–10.5)
WBC: 8.1 10*3/uL (ref 4.0–10.5)

## 2016-09-04 LAB — URINALYSIS, ROUTINE W REFLEX MICROSCOPIC
BILIRUBIN URINE: NEGATIVE
GLUCOSE, UA: NEGATIVE mg/dL
HGB URINE DIPSTICK: NEGATIVE
Ketones, ur: NEGATIVE mg/dL
NITRITE: POSITIVE — AB
PH: 6 (ref 5.0–8.0)
Protein, ur: 30 mg/dL — AB
SPECIFIC GRAVITY, URINE: 1.021 (ref 1.005–1.030)

## 2016-09-04 LAB — TSH: TSH: 2.004 u[IU]/mL (ref 0.350–4.500)

## 2016-09-04 LAB — FERRITIN: Ferritin: 100 ng/mL (ref 11–307)

## 2016-09-04 LAB — I-STAT CG4 LACTIC ACID, ED: Lactic Acid, Venous: 1.06 mmol/L (ref 0.5–1.9)

## 2016-09-04 LAB — I-STAT BETA HCG BLOOD, ED (MC, WL, AP ONLY)

## 2016-09-04 LAB — SEDIMENTATION RATE: SED RATE: 82 mm/h — AB (ref 0–22)

## 2016-09-04 LAB — IRON AND TIBC
IRON: 10 ug/dL — AB (ref 28–170)
Saturation Ratios: 4 % — ABNORMAL LOW (ref 10.4–31.8)
TIBC: 230 ug/dL — AB (ref 250–450)
UIBC: 220 ug/dL

## 2016-09-04 LAB — RETICULOCYTES
RBC.: 3.99 MIL/uL (ref 3.87–5.11)
RETIC CT PCT: 0.9 % (ref 0.4–3.1)
Retic Count, Absolute: 35.9 10*3/uL (ref 19.0–186.0)

## 2016-09-04 LAB — FOLATE: FOLATE: 7.3 ng/mL (ref >5.9)

## 2016-09-04 LAB — VITAMIN B12: VITAMIN B 12: 146 pg/mL — AB (ref 180–914)

## 2016-09-04 MED ORDER — ENOXAPARIN SODIUM 40 MG/0.4ML ~~LOC~~ SOLN
40.0000 mg | SUBCUTANEOUS | Status: DC
  Administered 2016-09-04 – 2016-09-09 (×5): 40 mg via SUBCUTANEOUS
  Filled 2016-09-04 (×7): qty 0.4

## 2016-09-04 MED ORDER — SODIUM CHLORIDE 0.9% FLUSH
10.0000 mL | INTRAVENOUS | Status: DC | PRN
  Administered 2016-09-04 (×2): 20 mL
  Administered 2016-09-09: 10 mL
  Filled 2016-09-04 (×3): qty 40

## 2016-09-04 MED ORDER — ALTEPLASE 2 MG IJ SOLR
2.0000 mg | Freq: Once | INTRAMUSCULAR | Status: AC
  Administered 2016-09-04: 2 mg
  Filled 2016-09-04: qty 2

## 2016-09-04 MED ORDER — SODIUM CHLORIDE 0.9 % IV SOLN
INTRAVENOUS | Status: AC
  Administered 2016-09-04 (×2): via INTRAVENOUS

## 2016-09-04 MED ORDER — CEFAZOLIN IN D5W 1 GM/50ML IV SOLN
1.0000 g | Freq: Once | INTRAVENOUS | Status: AC
  Administered 2016-09-04: 1 g via INTRAVENOUS
  Filled 2016-09-04: qty 50

## 2016-09-04 MED ORDER — ONDANSETRON HCL 4 MG/2ML IJ SOLN
4.0000 mg | Freq: Once | INTRAMUSCULAR | Status: AC
  Administered 2016-09-04: 4 mg via INTRAVENOUS
  Filled 2016-09-04: qty 2

## 2016-09-04 MED ORDER — ONDANSETRON HCL 4 MG PO TABS
4.0000 mg | ORAL_TABLET | Freq: Four times a day (QID) | ORAL | Status: DC | PRN
  Filled 2016-09-04: qty 1

## 2016-09-04 MED ORDER — HYDROMORPHONE HCL 1 MG/ML IJ SOLN
0.5000 mg | INTRAMUSCULAR | Status: DC | PRN
  Administered 2016-09-04 – 2016-09-09 (×22): 0.5 mg via INTRAVENOUS
  Filled 2016-09-04 (×23): qty 0.5

## 2016-09-04 MED ORDER — CEFAZOLIN SODIUM-DEXTROSE 2-4 GM/100ML-% IV SOLN
2.0000 g | Freq: Three times a day (TID) | INTRAVENOUS | Status: DC
  Administered 2016-09-04 – 2016-09-07 (×11): 2 g via INTRAVENOUS
  Filled 2016-09-04 (×11): qty 100

## 2016-09-04 MED ORDER — ONDANSETRON HCL 4 MG/2ML IJ SOLN
4.0000 mg | Freq: Four times a day (QID) | INTRAMUSCULAR | Status: DC | PRN
  Administered 2016-09-05 – 2016-09-09 (×5): 4 mg via INTRAVENOUS
  Filled 2016-09-04 (×5): qty 2

## 2016-09-04 NOTE — H&P (Signed)
History and Physical    Kathryn Ewing ZOX:[id number] DOB: 12/24/1985 DOA: 09/03/2016  PCP: PROVIDER NOT IN SYSTEM  Patient coming from: Home.  Chief Complaint: Increasing discharge and pain from the left ischial decubitus ulcer.  HPI: Kathryn CoolerKayla Gilpatrick is a 31 y.o. 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 Patient states last 1 week patient has been having increasing discharge and pain from the left ischial decubitus ulcer. Patient also states she has been having subjective feeling of fever and chills. In the ER on exam by the ER physician there was some greenish discharge from the left ischial decubitus ulcer. Patient is mildly tachycardic. Patient is being admitted for further management of possible worsening of patient's known history of chronic osteomyelitis.   ED Course: Patient was started on Ancef.  Review of Systems: As per HPI, rest all negative.   Past Medical History:  Diagnosis Date  . Amputee   . Osteomyelitis (HCC)   . S/P flap graft     Past Surgical History:  Procedure Laterality Date  . BACK SURGERY    . CESAREAN SECTION    . FREE FLAP GRAFT       reports that she has quit smoking. She has never used smokeless tobacco. She reports that she uses drugs, including Marijuana. She reports that she does not drink alcohol.  Allergies  Allergen Reactions  . Amoxicillin Nausea And Vomiting and Other (See Comments)    Has patient had a PCN reaction causing immediate rash, facial/tongue/throat swelling, SOB or lightheadedness with hypotension: No Has patient had a PCN reaction causing severe rash involving mucus  membranes or skin necrosis: No Has patient had a PCN reaction that required hospitalization No Has patient had a PCN reaction occurring within the last 10 years: Yes If all of the above answers are "NO", then may proceed with Cephalosporin use.  . Codeine Nausea And Vomiting  . Hydrocodone Nausea And Vomiting  . Meropenem Nausea And Vomiting  . Metronidazole Nausea And Vomiting  . Oxycodone-Acetaminophen Itching  . Piperacillin Sod-Tazobactam So Nausea And Vomiting  . Promethazine Nausea And Vomiting  . Diazepam Rash    Family History  Problem Relation Age of Onset  . Diabetes Mellitus II Other     Prior to Admission medications   Medication Sig Start Date End Date Taking? Authorizing Provider  ciprofloxacin (CIPRO) 500 MG tablet Take 1 tablet (500 mg total) by mouth 2 (two) times daily. Patient not taking: Reported on 09/03/2016 08/05/16   [place] Cyndie MullL Johnson, MD  traMADol (ULTRAM) 50 MG tablet Take 1 tablet (50 mg total) by mouth every 6 (six) hours as needed. Patient not taking: Reported on 09/03/2016 08/21/16   Rolan BuccoMelanie Belfi, MD    Physical Exam: Vitals:   09/03/16 1740 09/04/16 0019 09/04/16 0226 09/04/16 0432  BP: 109/60 101/60 107/60 115/69  Pulse: 97 115 112 100  Resp: 18 18 20 14   Temp: 98.5 F (36.9 C)  99 F (37.2 C)   TempSrc: Oral  Oral   SpO2: 100% 100% 99% 100%  Weight:      Height:          Constitutional:  Moderately built and nourished. Vitals:   09/03/16 1740 09/04/16 0019 09/04/16 0226 09/04/16 0432  BP: 109/60 101/60 107/60 115/69  Pulse: 97 115 112 100  Resp: 18 18 20 14   Temp: 98.5 F (36.9 C)  99 F (37.2 C)   TempSrc: Oral  Oral   SpO2: 100% 100% 99% 100%  Weight:      Height:       Eyes: Anicteric no pallor. ENMT: No discharge from the ears eyes nose or mouth. Neck: No mass felt. No neck rigidity. Respiratory: No rhonchi or crepitations. Cardiovascular: S1 and S2 heard tachycardic. Abdomen: Soft nontender bowel sounds  present. Musculoskeletal: Bilateral BKA. Skin: Left ischial area has a stage IV decubitus which at this time does not have active discharge and was cleaned by the ER physician. Neurologic: Alert awake oriented to time place and person. Patient has paraplegia. Psychiatric: Appears normal. Normal affect.   Labs on Admission: I have personally reviewed following labs and imaging studies  CBC:  Recent Labs Lab 09/04/16 0012  WBC 10.7*  NEUTROABS 6.9  HGB 10.2*  HCT 32.9*  MCV 77.0*  PLT 459*   Basic Metabolic Panel:  Recent Labs Lab 09/04/16 0012  NA 137  K 3.4*  CL 107  CO2 22  GLUCOSE 97  BUN 11  CREATININE 0.32*  CALCIUM 8.4*   GFR: Estimated Creatinine Clearance: 70.4 mL/min (by C-G formula based on SCr of 0.32 mg/dL (L)). Liver Function Tests:  Recent Labs Lab 09/04/16 0012  AST 11*  ALT 8*  ALKPHOS 64  BILITOT 0.4  PROT 8.1  ALBUMIN 3.2*   No results for input(s): LIPASE, AMYLASE in the last 168 hours. No results for input(s): AMMONIA in the last 168 hours. Coagulation Profile: No results for input(s): INR, PROTIME in the last 168 hours. Cardiac Enzymes: No results for input(s): CKTOTAL, CKMB, CKMBINDEX, TROPONINI in the last 168 hours. BNP (last 3 results) No results for input(s): PROBNP in the last 8760 hours. HbA1C: No results for input(s): HGBA1C in the last 72 hours. CBG: No results for input(s): GLUCAP in the last 168 hours. Lipid Profile: No results for input(s): CHOL, HDL, LDLCALC, TRIG, CHOLHDL, LDLDIRECT in the last 72 hours. Thyroid Function Tests: No results for input(s): TSH, T4TOTAL, FREET4, T3FREE, THYROIDAB in the last 72 hours. Anemia Panel: No results for input(s): VITAMINB12, FOLATE, FERRITIN, TIBC, IRON, RETICCTPCT in the last 72 hours. Urine analysis:    Component Value Date/Time   COLORURINE YELLOW 09/04/2016 0035   APPEARANCEUR HAZY (A) 09/04/2016 0035   LABSPEC 1.021 09/04/2016 0035   PHURINE 6.0 09/04/2016 0035    GLUCOSEU NEGATIVE 09/04/2016 0035   HGBUR NEGATIVE 09/04/2016 0035   BILIRUBINUR NEGATIVE 09/04/2016 0035   KETONESUR NEGATIVE 09/04/2016 0035   PROTEINUR 30 (A) 09/04/2016 0035   NITRITE POSITIVE (A) 09/04/2016 0035   LEUKOCYTESUR LARGE (A) 09/04/2016 0035   Sepsis Labs: @LABRCNTIP (procalcitonin:4,lacticidven:4) )No results found for this or any previous visit (from the past 240 hour(s)).   Radiological Exams on Admission: No results found.    Assessment/Plan Principal Problem:   Decubitus ulcer of ischial area Active Problems:   Chronic osteomyelitis (HCC)   Lower urinary tract infectious disease   Decubitus ulcer of ischial area, left, stage IV (HCC)    1. Possible infected stage IV ischial decubitus ulcer on the left side with possible osteomyelitis - patient has been restarted on Ancef at this time. Follow wound cultures. Since patient complained of significant pain I have ordered MRI of  the left hip and sedimentation rate. Continue hydration. 2. Microcytic hypochromic anemia - appears to be chronic. Check anemia panel. 3. T9 paraplegia.   DVT prophylaxis: Lovenox. Code Status: Full code.  Family Communication: No family at the bedside.  Disposition Plan: Home.  Consults called: Wound team Admission status: Observation.    Eduard Clos MD [facility] Pager [phone].  If 7PM-7AM, please contact night-coverage [email or web address] Password [facility]  09/04/2016, 4:35 AM

## 2016-09-04 NOTE — Progress Notes (Signed)
Pharmacy Antibiotic Note  Kathryn Ewing is a 31 y.o. female admitted on 09/03/2016 with osteomyelitis, decubitus ulcer, and UTI .  Pharmacy has been consulted for Cefazolin dosing.  Plan: Cefazolin 2gm iv q8hr  Height: 4\' 4"  (132.1 cm) Weight: 145 lb (65.8 kg) IBW/kg (Calculated) : 27.1  Temp (24hrs), Avg:98.6 F (37 C), Min:98.3 F (36.8 C), Max:99 F (37.2 C)   Recent Labs Lab 09/04/16 0012 09/04/16 0021  WBC 10.7*  --   CREATININE 0.32*  --   LATICACIDVEN  --  1.06    Estimated Creatinine Clearance: 70.4 mL/min (by C-G formula based on SCr of 0.32 mg/dL (L)).    Allergies  Allergen Reactions  . Amoxicillin Nausea And Vomiting and Other (See Comments)    Has patient had a PCN reaction causing immediate rash, facial/tongue/throat swelling, SOB or lightheadedness with hypotension: No Has patient had a PCN reaction causing severe rash involving mucus membranes or skin necrosis: No Has patient had a PCN reaction that required hospitalization No Has patient had a PCN reaction occurring within the last 10 years: Yes If all of the above answers are "NO", then may proceed with Cephalosporin use.  . Codeine Nausea And Vomiting  . Hydrocodone Nausea And Vomiting  . Meropenem Nausea And Vomiting  . Metronidazole Nausea And Vomiting  . Oxycodone-Acetaminophen Itching  . Piperacillin Sod-Tazobactam So Nausea And Vomiting  . Promethazine Nausea And Vomiting  . Diazepam Rash    Antimicrobials this admission: Cefazolin 1/9 >>  Dose adjustments this admission: -  Microbiology results: pending  Thank you for allowing pharmacy to be a part of this patient's care.  Aleene DavidsonGrimsley Jr, Treniya Lobb Crowford 09/04/2016 5:08 AM

## 2016-09-04 NOTE — Care Management Obs Status (Signed)
MEDICARE OBSERVATION STATUS NOTIFICATION   Patient Details  Name: Kathryn CoolerKayla Kydd MRN: [id number] Date of Birth: 01/14/1986   Medicare Observation Status Notification Given:  Yes    Yves DillJeffries, Arpan Eskelson Christine, RN 09/04/2016, 11:52 AM

## 2016-09-04 NOTE — Progress Notes (Signed)
Subjective: Patient admitted this morning, see detailed H&P by Dr. Toniann Failkakrakandy 31 y.o. 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 Patient states last 1 week patient has been having increasing discharge and pain from the left ischial decubitus ulcer. Patient also states she has been having subjective feeling of fever and chills. In the ER on exam by the ER physician there was some greenish discharge from the left ischial decubitus ulcer. Patient is mildly tachycardic. Patient is being admitted for further management of possible worsening of patient's known history of chronic osteomyelitis.   Vitals:   09/04/16 0515 09/04/16 1500  BP: (!) 107/57 104/67  Pulse: (!) 104 92  Resp: 15 16  Temp: 98.7 F (37.1 C) 98.2 F (36.8 C)      A/P  Possible infected stage IV ischial decubitus ulcer/? Osteomyelitis Microcytic hypochromic anemia T9 paraplegia  Follow MRI results Continue cefazolin We'll consult Wound Care     Meredeth IdeGagan S Tiernan Suto [facility] Pager- [phone]

## 2016-09-04 NOTE — Consult Note (Signed)
WOC Nurse wound consult note Reason for Consult: left ischial tuberosity pressure injury, Stage 4. Patient states that she has had this wound for 4 months, since September albeit wound appearance is consistent with one of longer duration with closed (rolled) edges/epibole.  She states that [facility] cushion, yet still got an ulcer.  She does not appreciate reinforcement of teaching on every 15 minutes boosting, limiting sitting to one-hour periods, nutritional supplementation with protein, etc.  She is sitting upright in the bed at the time of my assessment (as opposed to reclining with HOB not elevated). I offer instruction about the benefits of not being in the seated position that are not appreciated today. Bedside RN tells me that patient is to go for radiographic study today (CT vs MRI) to rule out osteomyelitis. Wound type:Pressure Pressure Injury POA: Yes Measurement: 4cm x 3cm x 2cm with 2cm circumferential undermining Wound bed:Pal pink, moist with  Bone palpable Drainage (amount, consistency, odor) Moderate serous to light yellow with musty odor. Periwound: intact Dressing procedure/placement/frequency: I will add a silver hydrofiber dressing once daily with PRN changes for drainage strike-through. Patient would benefit from follow up by the outpatient wound care center of her choosing, if she is amenable to that. If she agrees, please order/arrange. WOC nursing team will not follow, but will remain available to this patient, the nursing and medical teams.  Please re-consult if needed. Thanks, Ladona MowLaurie Rileigh Kawashima, MSN, RN, GNP, Hans EdenCWOCN, CWON-AP, FAAN  Pager# [phone]

## 2016-09-05 ENCOUNTER — Encounter (HOSPITAL_COMMUNITY): Payer: Self-pay | Admitting: Internal Medicine

## 2016-09-05 DIAGNOSIS — D509 Iron deficiency anemia, unspecified: Secondary | ICD-10-CM

## 2016-09-05 DIAGNOSIS — M8660 Other chronic osteomyelitis, unspecified site: Secondary | ICD-10-CM

## 2016-09-05 DIAGNOSIS — L89304 Pressure ulcer of unspecified buttock, stage 4: Secondary | ICD-10-CM

## 2016-09-05 DIAGNOSIS — E538 Deficiency of other specified B group vitamins: Secondary | ICD-10-CM | POA: Diagnosis present

## 2016-09-05 HISTORY — DX: Deficiency of other specified B group vitamins: E53.8

## 2016-09-05 HISTORY — DX: Iron deficiency anemia, unspecified: D50.9

## 2016-09-05 LAB — BASIC METABOLIC PANEL
ANION GAP: 5 (ref 5–15)
BUN: 6 mg/dL (ref 6–20)
CHLORIDE: 108 mmol/L (ref 101–111)
CO2: 24 mmol/L (ref 22–32)
Calcium: 8.2 mg/dL — ABNORMAL LOW (ref 8.9–10.3)
Creatinine, Ser: <0.3 mg/dL — ABNORMAL LOW (ref 0.44–1.00)
Glucose, Bld: 85 mg/dL (ref 65–99)
POTASSIUM: 3.7 mmol/L (ref 3.5–5.1)
SODIUM: 137 mmol/L (ref 135–145)

## 2016-09-05 LAB — CBC WITH DIFFERENTIAL/PLATELET
BASOS ABS: 0 10*3/uL (ref 0.0–0.1)
Basophils Relative: 0 %
EOS ABS: 0.2 10*3/uL (ref 0.0–0.7)
EOS PCT: 4 %
HCT: 30 % — ABNORMAL LOW (ref 36.0–46.0)
HEMOGLOBIN: 9 g/dL — AB (ref 12.0–15.0)
Lymphocytes Relative: 38 %
Lymphs Abs: 2 10*3/uL (ref 0.7–4.0)
MCH: 23.4 pg — ABNORMAL LOW (ref 26.0–34.0)
MCHC: 30 g/dL (ref 30.0–36.0)
MCV: 78.1 fL (ref 78.0–100.0)
Monocytes Absolute: 0.4 10*3/uL (ref 0.1–1.0)
Monocytes Relative: 7 %
NEUTROS PCT: 51 %
Neutro Abs: 2.7 10*3/uL (ref 1.7–7.7)
PLATELETS: 327 10*3/uL (ref 150–400)
RBC: 3.84 MIL/uL — AB (ref 3.87–5.11)
RDW: 18.1 % — ABNORMAL HIGH (ref 11.5–15.5)
WBC: 5.2 10*3/uL (ref 4.0–10.5)

## 2016-09-05 LAB — MAGNESIUM: MAGNESIUM: 1.8 mg/dL (ref 1.7–2.4)

## 2016-09-05 MED ORDER — CYANOCOBALAMIN 1000 MCG/ML IJ SOLN
1000.0000 ug | Freq: Every day | INTRAMUSCULAR | Status: DC
  Administered 2016-09-05 – 2016-09-09 (×5): 1000 ug via INTRAMUSCULAR
  Filled 2016-09-05 (×5): qty 1

## 2016-09-05 MED ORDER — TRAMADOL HCL 50 MG PO TABS: 50.0000 mg | ORAL_TABLET | Freq: Four times a day (QID) | ORAL | Status: DC | PRN

## 2016-09-05 MED ORDER — HYDROCODONE-ACETAMINOPHEN 5-325 MG PO TABS: 1.0000 | ORAL_TABLET | ORAL | Status: DC | PRN

## 2016-09-05 NOTE — Progress Notes (Signed)
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 Patient presented to the ED with worsening pain and discharge from the left ischial decubitus ulcer. Patient empirically on IV Ancef. MRI of the left hip obtained. Wound care following.   Assessment & Plan:   Principal Problem:   Decubitus ulcer of ischial area Active Problems:   Chronic osteomyelitis (HCC)   Lower urinary tract infectious disease   Decubitus ulcer of ischial area, left, stage IV (HCC)   B12 deficiency   Anemia, iron deficiency  #1 decubitus ulcer of the ischial area Stage 4 Patient had presented with a history of chronic osteomyelitis and recent treatment of left issue chronic osteomyelitis with Ancef for about 6-8 weeks who presented to the ED with worsening pain and discharge from the left ischial decubitus ulcer. Patient had some subjective fevers and chills. Patient noted to be afebrile. Patient with normal white count. Patient stated recently finished 2 weeks of IV antibiotics from prior discharge. MRI of the left hip with similar appearance of chronic osteomyelitis involving the left issue with destructive findings involving the left inferior pubic ramus. Large ulceration below left ischial extending nearly to the bone margin. New focal edema posteriorly in the right issue may reflect early chronic osteomyelitis. Bilateral hip joint effusions with suspected draining sinus tracts laterally along both hips. 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 Patient currently on IV Ancef. Will likely need outpatient wound care.  # 2 B 12 deficiency B 12 1000  MCG's IM daily 1 week, and then every weekly 1 month, and then monthly. Patient will need outpatient follow-up.  #3 T9 paraplegia  #4 iron deficiency anemia Hemoglobin currently stable at 9. Will place on oral iron supplementation.    DVT prophylaxis: Lovenox. Code Status: Full Family Communication: Updated patient as much as I could have that patient seemed frustrated and wanting me to leave the room prior to full update. Disposition Plan: Home when medically stable.   Consultants:   Wound Care nurse.  Procedures:   MRI left hip 09/04/2016  Antimicrobials:  IV Ancef 09/04/2016   Subjective: Sitting up in bed about to eat lunch. Patient frustrated and asking me to do my physical exam quickly so she can eat. Patient states was discharged quickly the last time she was hospitalized. Per nursing notes patient has been refusing dressing changes as prescribed by wound care nursing.  Objective: Vitals:   09/04/16 0515 09/04/16 1500 09/04/16 2037 09/05/16 0643  BP: (!) 107/57 104/67 (!) 94/53 (!) 108/54  Pulse: (!) 104 92 84 88  Resp: 15 16 16 16   Temp: 98.7 F (37.1 C) 98.2 F (36.8 C) 98.2 F (36.8 C) 98 F (36.7 C)  TempSrc: Oral Oral Oral Oral  SpO2: 100% 100% 100% 100%  Weight:      Height:        Intake/Output Summary (Last 24 hours) at 09/05/16 1220 Last data filed at 09/05/16 0400  Gross per 24 hour  Intake             2760 ml  Output  0 ml  Net             2760 ml   Filed Weights   09/03/16 1335  Weight: 65.8 kg (145 lb)    Examination:  General exam: Appears calm and comfortable  Respiratory system: Clear to auscultation. Respiratory effort normal. Cardiovascular system: S1 & S2 heard, RRR. No JVD, murmurs, rubs, gallops or clicks. No pedal edema. Gastrointestinal system: Abdomen is nondistended, soft and nontender. No organomegaly or masses felt. Normal bowel sounds heard. Central nervous system: Alert and oriented. No focal  neurological deficits. Extremities: Bilateral lower extremity amputee Skin: Patient with a left ischial area stage IV decubitus which she is refusing to be examined at this time. No rashes, lesions or ulcers Psychiatry: Judgement and insight appear normal. Mood & affect appropriate.     Data Reviewed: I have personally reviewed following labs and imaging studies  CBC:  Recent Labs Lab 09/04/16 0012 09/04/16 1556 09/05/16 1034  WBC 10.7* 8.1 5.2  NEUTROABS 6.9 3.9 2.7  HGB 10.2* 9.4* 9.0*  HCT 32.9* 31.4* 30.0*  MCV 77.0* 77.9* 78.1  PLT 459* 411* 327   Basic Metabolic Panel:  Recent Labs Lab 09/04/16 0012 09/04/16 1556 09/05/16 1034  NA 137 138 137  K 3.4* 3.5 3.7  CL 107 106 108  CO2 22 25 24   GLUCOSE 97 94 85  BUN 11 10 6   CREATININE 0.32* <0.30* <0.30*  CALCIUM 8.4* 8.4* 8.2*  MG  --   --  1.8   GFR: CrCl cannot be calculated (This lab value cannot be used to calculate CrCl because it is not a number: <0.30). Liver Function Tests:  Recent Labs Lab 09/04/16 0012 09/04/16 1556  AST 11* 12*  ALT 8* 6*  ALKPHOS 64 62  BILITOT 0.4 0.3  PROT 8.1 7.7  ALBUMIN 3.2* 3.1*   No results for input(s): LIPASE, AMYLASE in the last 168 hours. No results for input(s): AMMONIA in the last 168 hours. Coagulation Profile: No results for input(s): INR, PROTIME in the last 168 hours. Cardiac Enzymes: No results for input(s): CKTOTAL, CKMB, CKMBINDEX, TROPONINI in the last 168 hours. BNP (last 3 results) No results for input(s): PROBNP in the last 8760 hours. HbA1C: No results for input(s): HGBA1C in the last 72 hours. CBG: No results for input(s): GLUCAP in the last 168 hours. Lipid Profile: No results for input(s): CHOL, HDL, LDLCALC, TRIG, CHOLHDL, LDLDIRECT in the last 72 hours. Thyroid Function Tests:  Recent Labs  09/04/16 1556  TSH 2.004   Anemia Panel:  Recent Labs  09/04/16 1556  VITAMINB12 146*  FOLATE 7.3  FERRITIN 100  TIBC 230*  IRON 10*    RETICCTPCT 0.9   Sepsis Labs:  Recent Labs Lab 09/04/16 0021  LATICACIDVEN 1.06    Recent Results (from the past 240 hour(s))  Blood culture (routine x 2)     Status: None (Preliminary result)   Collection Time: 09/04/16 12:12 AM  Result Value Ref Range Status   Specimen Description BLOOD BLOOD RIGHT FOREARM  Final   Special Requests IN PEDIATRIC BOTTLE 1CC  Final   Culture   Final    NO GROWTH 1 DAY [facility]    Report Status PENDING  Incomplete  Blood culture (routine x 2)     Status: None (Preliminary result)   Collection Time: 09/04/16 12:23 AM  Result Value Ref Range Status   Specimen Description BLOOD RIGHT ANTECUBITAL  Final   Special Requests BOTTLES DRAWN  AEROBIC AND ANAEROBIC 5CC  Final   Culture   Final    NO GROWTH 1 DAY Performed at [facility]    Report Status PENDING  Incomplete         Radiology Studies: Mr Hip Left Wo Contrast  Result Date: 09/04/2016 CLINICAL DATA:  Left ischial decubitus ulcer. Assessment for worsening. EXAM: MR OF THE LEFT HIP WITHOUT CONTRAST TECHNIQUE: Multiplanar, multisequence MR imaging was performed. No intravenous contrast was administered. COMPARISON:  08/20/2016 and 08/03/2016 FINDINGS: Bones: Similar appearance of chronic osteomyelitis involving the left ischium and posterior wall left acetabulum and with extensive involvement and destruction of the left inferior pubic ramus. Small focal area of abnormal osseous edema favoring chronic osteomyelitis posteriorly in the right ischium, new compared to the prior exam. Exaggerated lumbosacral carrying angle Articular cartilage and labrum Articular cartilage:  Degenerative chondral thinning in both hips. Labrum:  No definite tear seen. Joint or bursal effusion Joint effusion: Bilateral hip joint effusions. Suspected gas density in the right hip joint. Bilateral draining sinus tracks from the lateral hips extending to the lateral cutaneous margins. Bursae:   Small amount of right trochanteric bursitis. Muscles and tendons Muscles and tendons: Phlegmon and probably draining sinus tract extending through the right gluteal musculature laterally. Similar finding on the left. Abnormal edema in the left hip adductor musculature, left operator internus, and left proximal hamstring musculature, roughly similar to the prior exam. Other findings Miscellaneous: Reactive adenopathy along the pelvic sidewalls and inguinal regions. Inflammatory edema extends into the left perineum and left labia majora. This is similar to the prior exam. IMPRESSION: 1. Similar appearance of chronic osteomyelitis involving the left ischium and with destructive findings involving the left inferior pubic ramus. Large ulceration below the left ischium extending nearly to the bony margin. 2. New focal edema posteriorly in the right ischium may reflect early chronic osteomyelitis. 3. Bilateral hip joint effusions with suspected draining sinus tracks laterally along both hips. Reactive adenopathy in the pelvis. 4. 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 Electronically Signed   By: Gaylyn Rong M.D.   On: 09/04/2016 15:33   Dg Chest Port 1 View  Result Date: 09/04/2016 CLINICAL DATA:  Cough EXAM: PORTABLE CHEST 1 VIEW COMPARISON:  08/02/2016 FINDINGS: Cardiomediastinal silhouette is stable. Right IJ central line with tip in SVC right atrium junction is unchanged in position. Again noted metallic fixation rods lower thoracic and upper lumbar spine. No infiltrate or pleural effusion. No pulmonary edema. IMPRESSION: No active disease. Electronically Signed   By: Natasha Mead M.D.   On: 09/04/2016 10:59        Scheduled Meds: .  ceFAZolin (ANCEF) IV  2 g Intravenous Q8H  . cyanocobalamin  1,000 mcg Intramuscular Daily  . enoxaparin (LOVENOX) injection  40 mg  Subcutaneous Q24H   Continuous Infusions:   LOS: 1 day    Time spent: 35 minutes.    [facility], MD [facility] Pager [phone]  If 7PM-7AM, please contact night-coverage [email or web address] Password [email or web address] 09/05/2016, 12:20 PM

## 2016-09-05 NOTE — Progress Notes (Addendum)
Pt refused dressing change twice. Pt agreed to have dressing changed after breakfast this morning... Will f/u with day shift nurse, my need to negotiate and set boundaries.   Supplies for dressing change are in pink  bin in room on drawer stand.

## 2016-09-06 ENCOUNTER — Encounter (HOSPITAL_COMMUNITY): Payer: Self-pay | Admitting: Surgery

## 2016-09-06 DIAGNOSIS — N390 Urinary tract infection, site not specified: Secondary | ICD-10-CM

## 2016-09-06 DIAGNOSIS — Z89512 Acquired absence of left leg below knee: Secondary | ICD-10-CM

## 2016-09-06 DIAGNOSIS — Z89511 Acquired absence of right leg below knee: Secondary | ICD-10-CM

## 2016-09-06 DIAGNOSIS — L89324 Pressure ulcer of left buttock, stage 4: Secondary | ICD-10-CM

## 2016-09-06 LAB — CBC
HCT: 30 % — ABNORMAL LOW (ref 36.0–46.0)
Hemoglobin: 9.1 g/dL — ABNORMAL LOW (ref 12.0–15.0)
MCH: 24.1 pg — ABNORMAL LOW (ref 26.0–34.0)
MCHC: 30.3 g/dL (ref 30.0–36.0)
MCV: 79.4 fL (ref 78.0–100.0)
PLATELETS: 353 10*3/uL (ref 150–400)
RBC: 3.78 MIL/uL — ABNORMAL LOW (ref 3.87–5.11)
RDW: 18.2 % — AB (ref 11.5–15.5)
WBC: 6.4 10*3/uL (ref 4.0–10.5)

## 2016-09-06 LAB — BASIC METABOLIC PANEL
ANION GAP: 5 (ref 5–15)
BUN: 7 mg/dL (ref 6–20)
CALCIUM: 8.5 mg/dL — AB (ref 8.9–10.3)
CO2: 27 mmol/L (ref 22–32)
Chloride: 108 mmol/L (ref 101–111)
Creatinine, Ser: <0.3 mg/dL — ABNORMAL LOW (ref 0.44–1.00)
GLUCOSE: 91 mg/dL (ref 65–99)
Potassium: 3.6 mmol/L (ref 3.5–5.1)
SODIUM: 140 mmol/L (ref 135–145)

## 2016-09-06 MED ORDER — LORAZEPAM 1 MG PO TABS
1.0000 mg | ORAL_TABLET | Freq: Two times a day (BID) | ORAL | Status: DC | PRN
  Administered 2016-09-07 – 2016-09-08 (×3): 1 mg via ORAL
  Filled 2016-09-06 (×4): qty 1

## 2016-09-06 MED ORDER — POLYSACCHARIDE IRON COMPLEX 150 MG PO CAPS
150.0000 mg | ORAL_CAPSULE | Freq: Every day | ORAL | Status: DC
  Administered 2016-09-06 – 2016-09-09 (×5): 150 mg via ORAL
  Filled 2016-09-06 (×4): qty 1

## 2016-09-06 MED ORDER — LORAZEPAM 1 MG PO TABS
1.0000 mg | ORAL_TABLET | Freq: Once | ORAL | Status: AC
  Administered 2016-09-06: 1 mg via ORAL
  Filled 2016-09-06: qty 1

## 2016-09-06 NOTE — Care Management Note (Signed)
Case Management Note  Patient Details  Name: Kathryn CoolerKayla Ewing MRN: [id number] Date of Birth: 01/04/1986  Subjective/Objective:                  Decubitus ulcer of ischial area Active Problems:   Chronic osteomyelitis [facility](HCC) Action/Plan: Discharge planning Expected Discharge Date:  09/05/16               Expected Discharge Plan:  Home w Home Health Services  In-House Referral:     Discharge planning Services  CM Consult  Post Acute Care Choice:  Home Health Choice offered to:     DME Arranged:    DME Agency:     HH Arranged:    HH Agency:     Status of Service:  In process, will continue to follow  If discussed at Long Length of Stay Meetings, dates discussed:    Additional Comments: [facility]. CM has called [facility] on [address] and Gaynelle AduRobbie states pt will receive a call from them for appointment day and time; CM has placed this on AVS.  CM continuing to follow for discharge needs. Yves DillJeffries, Nichola Warren Christine, RN 09/06/2016, 3:35 PM

## 2016-09-06 NOTE — Consult Note (Signed)
[facility] Consult/Admission Note  Kathryn Ewing 1986/05/04  [id number].    Requesting MD: Dr. Irine Seal Chief Complaint/Reason for Consult: Left ischial Decubitus Ulcer  HPI:   Kathryn Ewing is a 31 y.o. 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 Pt states she always has pain in this area but it has progressively worsened since she stopped the course of antibiotics around 08/20/16. Associated subjective feeling of fever and chills. Pt states she does her own wound care.  Subjective: Currently pt is not experiencing any fever or chills but she is complaining of pain in her left hip area around her sacral wound that is severe, 7/10, non radiating, worse with pressure or touch of the region of her wound with associated intermittent nausea. Pt denies vomiting, recent illness, changes in bowel habits, blood in her stools.   Ed Course: EDP noted greenish discharge from the wound. Pt was mildly tachycardic, afebrile.  WBC 10.7. Lactic acid was WNL. Imaging: MR Hip Similar appearance of chronic osteomyelitis involving the left ischium and with destructive findings involving the left inferior pubic ramus. Large ulceration below the left ischium extending nearly to the bony margin. New focal edema posteriorly in the right ischium may reflect early chronic osteomyelitis. Bilateral hip joint effusions with suspected draining sinus tracks laterally along both hips. Reactive adenopathy in the pelvis. 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  ROS:  Review of Systems  Constitutional: Positive for chills and fever. Negative for diaphoresis.  HENT: Negative for sore throat.   Respiratory: Negative for shortness of breath.   Cardiovascular: Negative for chest pain.  Gastrointestinal: Positive for nausea. Negative for abdominal pain, blood in stool, constipation, diarrhea and vomiting.  Skin: Negative for rash.       + for wound  Neurological: Negative for dizziness, loss of consciousness and headaches.  All other systems reviewed and are negative.    Family History  Problem Relation Age of Onset  . Diabetes Mellitus II Other     Past Medical History:  Diagnosis Date  . Amputee   . Amputee, below knee, left ([place])   . Amputee, below knee, right ([place])   . Anemia, iron deficiency 09/05/2016  . B12 deficiency 09/05/2016  . Osteomyelitis ([place])   . S/P flap graft     Past Surgical History:  Procedure Laterality Date  . BACK SURGERY    . CESAREAN SECTION    . FREE FLAP GRAFT      Social History:  reports that she has quit smoking. She has never used smokeless tobacco. She reports that she uses drugs, including Marijuana. She reports that she does not drink alcohol.  Allergies:  Allergies  Allergen Reactions  . Amoxicillin Nausea And Vomiting and Other (See Comments)    Has patient had a PCN reaction causing immediate rash, facial/tongue/throat swelling, SOB or lightheadedness with hypotension: No Has patient had a PCN reaction causing severe rash involving mucus membranes or skin necrosis: No Has patient had a PCN reaction that required hospitalization No Has patient  had a PCN reaction occurring within the last 10 years: Yes If all of the above answers are "NO", then may proceed with Cephalosporin use.  . Codeine Nausea And Vomiting  . Hydrocodone Nausea And Vomiting  . Meropenem Nausea And Vomiting  . Metronidazole Nausea And Vomiting  .  Oxycodone-Acetaminophen Itching  . Piperacillin Sod-Tazobactam So Nausea And Vomiting  . Promethazine Nausea And Vomiting  . Diazepam Rash    Medications Prior to Admission  Medication Sig Dispense Refill  . ciprofloxacin (CIPRO) 500 MG tablet Take 1 tablet (500 mg total) by mouth 2 (two) times daily. (Patient not taking: Reported on 09/03/2016) 10 tablet 0  . traMADol (ULTRAM) 50 MG tablet Take 1 tablet (50 mg total) by mouth every 6 (six) hours as needed. (Patient not taking: Reported on 09/03/2016) 15 tablet 0    Blood pressure (!) 101/52, pulse 92, temperature 98.7 F (37.1 C), temperature source Oral, resp. rate 16, height 4' 4" (1.321 m), weight 145 lb (65.8 kg), last menstrual period 08/14/2016, SpO2 100 %.  Physical Exam: General: pleasant, WD/WN white female who is laying in bed in NAD, appears agitated and tearful at times. HEENT: head is normocephalic, atraumatic.  Sclera are noninjected, no scleral icterus, lids normal, EOM grossly normal. Oral mucosa is pink and moist, lips normal. Neck with good ROM, supple. Heart: regular, rate, and rhythm.  No obvious murmurs, gallops, or rubs noted.   Lungs: Respiratory effort nonlabored, no accessory muscle use, rate normal Abd: soft, non distended, nontender, + bowel sounds MS: bilateral BKA, good ROM of hips and BUE, no edema noted. Skin: warm and dry with no rashes noted, roughly 5x4cm wound to left sacral region, good granulation tissue, no discharge noted, no foul odor (see photo below) Psych: A&Ox3 with an appropriate affect. Neuro: CM grossly 2-12 intact, normal speech      Results for orders placed or performed during the hospital encounter of 09/03/16 (from the past 48 hour(s))  CBC with Differential/Platelet     Status: Abnormal   Collection Time: 09/04/16  3:56 PM  Result Value Ref Range   WBC 8.1 4.0 - 10.5 K/uL   RBC 4.03 3.87 - 5.11 MIL/uL   Hemoglobin 9.4 (L) 12.0 - 15.0 g/dL   HCT 31.4 (L) 36.0 - 46.0 %   MCV 77.9  (L) 78.0 - 100.0 fL   MCH 23.3 (L) 26.0 - 34.0 pg   MCHC 29.9 (L) 30.0 - 36.0 g/dL   RDW 18.1 (H) 11.5 - 15.5 %   Platelets 411 (H) 150 - 400 K/uL   Neutrophils Relative % 47 %   Neutro Abs 3.9 1.7 - 7.7 K/uL   Lymphocytes Relative 40 %   Lymphs Abs 3.3 0.7 - 4.0 K/uL   Monocytes Relative 9 %   Monocytes Absolute 0.7 0.1 - 1.0 K/uL   Eosinophils Relative 4 %   Eosinophils Absolute 0.3 0.0 - 0.7 K/uL   Basophils Relative 0 %   Basophils Absolute 0.0 0.0 - 0.1 K/uL  Comprehensive metabolic panel     Status: Abnormal   Collection Time: 09/04/16  3:56 PM  Result Value Ref Range   Sodium 138 135 - 145 mmol/L   Potassium 3.5 3.5 - 5.1 mmol/L   Chloride 106 101 - 111 mmol/L   CO2 25 22 - 32 mmol/L   Glucose, Bld 94 65 - 99 mg/dL   BUN 10 6 - 20 mg/dL   Creatinine, Ser <0.30 (L) 0.44 - 1.00 mg/dL  Calcium 8.4 (L) 8.9 - 10.3 mg/dL   Total Protein 7.7 6.5 - 8.1 g/dL   Albumin 3.1 (L) 3.5 - 5.0 g/dL   AST 12 (L) 15 - 41 U/L   ALT 6 (L) 14 - 54 U/L   Alkaline Phosphatase 62 38 - 126 U/L   Total Bilirubin 0.3 0.3 - 1.2 mg/dL   GFR calc non Af Amer NOT CALCULATED >60 mL/min   GFR calc Af Amer NOT CALCULATED >60 mL/min    Comment: (NOTE) The eGFR has been calculated using the CKD EPI equation. This calculation has not been validated in all clinical situations. eGFR's persistently <60 mL/min signify possible Chronic Kidney Disease.    Anion gap 7 5 - 15  Sedimentation rate     Status: Abnormal   Collection Time: 09/04/16  3:56 PM  Result Value Ref Range   Sed Rate 82 (H) 0 - 22 mm/hr  TSH     Status: None   Collection Time: 09/04/16  3:56 PM  Result Value Ref Range   TSH 2.004 0.350 - 4.500 uIU/mL    Comment: Performed by a 3rd Generation assay with a functional sensitivity of <=0.01 uIU/mL.  Vitamin B12     Status: Abnormal   Collection Time: 09/04/16  3:56 PM  Result Value Ref Range   Vitamin B-12 146 (L) 180 - 914 pg/mL    Comment: (NOTE) This assay is not validated for  testing neonatal or myeloproliferative syndrome specimens for Vitamin B12 levels. Performed at [facility]   Folate     Status: None   Collection Time: 09/04/16  3:56 PM  Result Value Ref Range   Folate 7.3 >5.9 ng/mL    Comment: Performed at [facility]  Iron and TIBC     Status: Abnormal   Collection Time: 09/04/16  3:56 PM  Result Value Ref Range   Iron 10 (L) 28 - 170 ug/dL   TIBC 230 (L) 250 - 450 ug/dL   Saturation Ratios 4 (L) 10.4 - 31.8 %   UIBC 220 ug/dL    Comment: Performed at [facility]  Ferritin     Status: None   Collection Time: 09/04/16  3:56 PM  Result Value Ref Range   Ferritin 100 11 - 307 ng/mL    Comment: Performed at [facility]  Reticulocytes     Status: None   Collection Time: 09/04/16  3:56 PM  Result Value Ref Range   Retic Ct Pct 0.9 0.4 - 3.1 %   RBC. 3.99 3.87 - 5.11 MIL/uL   Retic Count, Manual 35.9 19.0 - 186.0 K/uL  CBC with Differential/Platelet     Status: Abnormal   Collection Time: 09/05/16 10:34 AM  Result Value Ref Range   WBC 5.2 4.0 - 10.5 K/uL   RBC 3.84 (L) 3.87 - 5.11 MIL/uL   Hemoglobin 9.0 (L) 12.0 - 15.0 g/dL   HCT 30.0 (L) 36.0 - 46.0 %   MCV 78.1 78.0 - 100.0 fL   MCH 23.4 (L) 26.0 - 34.0 pg   MCHC 30.0 30.0 - 36.0 g/dL   RDW 18.1 (H) 11.5 - 15.5 %   Platelets 327 150 - 400 K/uL   Neutrophils Relative % 51 %   Neutro Abs 2.7 1.7 - 7.7 K/uL   Lymphocytes Relative 38 %   Lymphs Abs 2.0 0.7 - 4.0 K/uL   Monocytes Relative 7 %   Monocytes Absolute 0.4 0.1 - 1.0 K/uL   Eosinophils Relative  4 %   Eosinophils Absolute 0.2 0.0 - 0.7 K/uL   Basophils Relative 0 %   Basophils Absolute 0.0 0.0 - 0.1 K/uL  Basic metabolic panel     Status: Abnormal   Collection Time: 09/05/16 10:34 AM  Result Value Ref Range   Sodium 137 135 - 145 mmol/L   Potassium 3.7 3.5 - 5.1 mmol/L   Chloride 108 101 - 111 mmol/L   CO2 24 22 - 32 mmol/L   Glucose, Bld 85 65 - 99 mg/dL   BUN 6 6 - 20 mg/dL    Creatinine, Ser <0.30 (L) 0.44 - 1.00 mg/dL   Calcium 8.2 (L) 8.9 - 10.3 mg/dL   GFR calc non Af Amer NOT CALCULATED >60 mL/min   GFR calc Af Amer NOT CALCULATED >60 mL/min    Comment: (NOTE) The eGFR has been calculated using the CKD EPI equation. This calculation has not been validated in all clinical situations. eGFR's persistently <60 mL/min signify possible Chronic Kidney Disease.    Anion gap 5 5 - 15  Magnesium     Status: None   Collection Time: 09/05/16 10:34 AM  Result Value Ref Range   Magnesium 1.8 1.7 - 2.4 mg/dL  Wound or Superficial Culture     Status: None (Preliminary result)   Collection Time: 09/05/16 11:00 AM  Result Value Ref Range   Specimen Description WOUND LT HIP    Special Requests NONE    Gram Stain      FEW WBC PRESENT, PREDOMINANTLY MONONUCLEAR RARE GRAM NEGATIVE RODS    Culture      FEW GRAM NEGATIVE RODS CULTURE REINCUBATED FOR BETTER GROWTH Performed at [facility]    Report Status PENDING   CBC     Status: Abnormal   Collection Time: 09/06/16  4:52 AM  Result Value Ref Range   WBC 6.4 4.0 - 10.5 K/uL   RBC 3.78 (L) 3.87 - 5.11 MIL/uL   Hemoglobin 9.1 (L) 12.0 - 15.0 g/dL   HCT 30.0 (L) 36.0 - 46.0 %   MCV 79.4 78.0 - 100.0 fL   MCH 24.1 (L) 26.0 - 34.0 pg   MCHC 30.3 30.0 - 36.0 g/dL   RDW 18.2 (H) 11.5 - 15.5 %   Platelets 353 150 - 400 K/uL  Basic metabolic panel     Status: Abnormal   Collection Time: 09/06/16  4:52 AM  Result Value Ref Range   Sodium 140 135 - 145 mmol/L   Potassium 3.6 3.5 - 5.1 mmol/L   Chloride 108 101 - 111 mmol/L   CO2 27 22 - 32 mmol/L   Glucose, Bld 91 65 - 99 mg/dL   BUN 7 6 - 20 mg/dL   Creatinine, Ser <0.30 (L) 0.44 - 1.00 mg/dL   Calcium 8.5 (L) 8.9 - 10.3 mg/dL   GFR calc non Af Amer NOT CALCULATED >60 mL/min   GFR calc Af Amer NOT CALCULATED >60 mL/min    Comment: (NOTE) The eGFR has been calculated using the CKD EPI equation. This calculation has not been validated in all clinical  situations. eGFR's persistently <60 mL/min signify possible Chronic Kidney Disease.    Anion gap 5 5 - 15   Mr Hip Left Wo Contrast  Result Date: 09/04/2016 CLINICAL DATA:  Left ischial decubitus ulcer. Assessment for worsening. EXAM: MR OF THE LEFT HIP WITHOUT CONTRAST TECHNIQUE: Multiplanar, multisequence MR imaging was performed. No intravenous contrast was administered. COMPARISON:  08/20/2016 and 08/03/2016 FINDINGS: Bones: Similar appearance  of chronic osteomyelitis involving the left ischium and posterior wall left acetabulum and with extensive involvement and destruction of the left inferior pubic ramus. Small focal area of abnormal osseous edema favoring chronic osteomyelitis posteriorly in the right ischium, new compared to the prior exam. Exaggerated lumbosacral carrying angle Articular cartilage and labrum Articular cartilage:  Degenerative chondral thinning in both hips. Labrum:  No definite tear seen. Joint or bursal effusion Joint effusion: Bilateral hip joint effusions. Suspected gas density in the right hip joint. Bilateral draining sinus tracks from the lateral hips extending to the lateral cutaneous margins. Bursae:  Small amount of right trochanteric bursitis. Muscles and tendons Muscles and tendons: Phlegmon and probably draining sinus tract extending through the right gluteal musculature laterally. Similar finding on the left. Abnormal edema in the left hip adductor musculature, left operator internus, and left proximal hamstring musculature, roughly similar to the prior exam. Other findings Miscellaneous: Reactive adenopathy along the pelvic sidewalls and inguinal regions. Inflammatory edema extends into the left perineum and left labia majora. This is similar to the prior exam. IMPRESSION: 1. Similar appearance of chronic osteomyelitis involving the left ischium and with destructive findings involving the left inferior pubic ramus. Large ulceration below the left ischium extending  nearly to the bony margin. 2. New focal edema posteriorly in the right ischium may reflect early chronic osteomyelitis. 3. Bilateral hip joint effusions with suspected draining sinus tracks laterally along both hips. Reactive adenopathy in the pelvis. 4. 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 Electronically Signed   By: Van Clines M.D.   On: 09/04/2016 15:33      Assessment/Plan  Left ischial Decubitus Ulcer stage 4 - wound looks well without foul odor or discharge. Good granulation tissue is noted. Pt takes good care of this wound.  - Does not appear to need surgical intervention at this time.  - would recommend consult to ortho to have pt evaluated for osteomyelitis.  Chronic ostemyellitis T9 paraplegia Iron deficiency anemia B12 deficiency Bilateral BKA  Thank you for the consult. We will sign off for now. Please page [place] with any questions or concerns.   Kalman Drape, [facility] Surgery 09/06/2016, 2:10 PM Pager: [phone] Consults: [phone] Mon-Fri 7:00 am-4:30 pm Sat-Sun 7:00 am-11:30 am

## 2016-09-06 NOTE — Progress Notes (Signed)
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 Patient presented to the ED with worsening pain and discharge from the left ischial decubitus ulcer. Patient empirically on IV Ancef. MRI of the left hip obtained. Wound care following.   Assessment & Plan:   Principal Problem:   Chronic osteomyelitis (HCC) Active Problems:   Lower urinary tract infectious disease   Amputee, below knee, left (HCC)   Amputee, below knee, right (HCC)   Decubitus ulcer of ischial area, left, stage IV (HCC)   B12 deficiency   Anemia, iron deficiency  #1 decubitus ulcer of the ischial area Stage 4 Patient had presented with a history of chronic osteomyelitis and recent treatment of left issue chronic osteomyelitis with Ancef for about 6-8 weeks who presented to the ED with worsening pain and discharge from the left ischial decubitus ulcer. Patient had some subjective fevers and chills. Patient noted to be afebrile. Patient with normal white count. Patient stated recently finished 2 weeks of IV antibiotics from prior discharge. MRI of the left hip with similar appearance of chronic osteomyelitis involving the left issue with destructive findings involving the left inferior pubic ramus. Large ulceration below left ischial extending nearly to the bone margin. New focal edema posteriorly in the right issue may reflect early chronic osteomyelitis. Bilateral hip joint effusions with suspected draining sinus tracts laterally along both hips. 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 Patient currently on IV Ancef. Will likely need outpatient wound  care. Will consult with general surgery for further evaluation.  # 2 B 12 deficiency B 12 1000 MCG's IM daily 1 week, and then every weekly 1 month, and then monthly. Patient will need outpatient follow-up.  #3 T9 paraplegia  #4 iron deficiency anemia Hemoglobin currently stable at 9. Will place on oral iron supplementation.  #5 probable urinary tract infection Urine cultures pending. IV Ancef.    DVT prophylaxis: Lovenox. Code Status: Full Family Communication: Updated patient no family at bedside. Disposition Plan: Home when medically stable.   Consultants:   Wound Care nurse.  Procedures:   MRI left hip 09/04/2016  Antimicrobials:  IV Ancef 09/04/2016   Subjective: Sitting laying down complaining of left hip pain after dressings have been changed today. Patient more pleasant today. Patient denies any chest pain. No shortness of breath. Patient asking whether she is appropriate for suprapubic catheter.   Objective: Vitals:   09/05/16 0643 09/05/16 1347 09/05/16 2231 09/06/16 0718  BP: (!) 108/54 (!) 118/52 (!) 103/52 (!) 101/52  Pulse: 88 68 89 92  Resp: 16 16 16 16   Temp: 98 F (36.7 C) 98.6 F (37 C) 98.5 F (36.9 C) 98.7 F (37.1 C)  TempSrc: Oral Oral Oral Oral  SpO2: 100% 100% 100% 100%  Weight:      Height:        Intake/Output Summary (Last 24 hours) at 09/06/16 1204 Last data filed at 09/06/16 0816  Gross per 24 hour  Intake              950 ml  Output              650  ml  Net              300 ml   Filed Weights   09/03/16 1335  Weight: 65.8 kg (145 lb)    Examination:  General exam: Appears calm and comfortable  Respiratory system: Clear to auscultation. Respiratory effort normal. Cardiovascular system: S1 & S2 heard, RRR. No JVD, murmurs, rubs, gallops or clicks. No pedal edema. Gastrointestinal system: Abdomen is nondistended, soft and nontender. No organomegaly or masses felt. Normal bowel sounds heard. Central nervous system:  Alert and oriented. No focal neurological deficits. Extremities: Bilateral lower extremity amputee Skin: Patient with a left ischial area stage IV decubitus which she is refusing to be examined at this time. No rashes, lesions or ulcers Psychiatry: Judgement and insight appear normal. Mood & affect appropriate.     Data Reviewed: I have personally reviewed following labs and imaging studies  CBC:  Recent Labs Lab 09/04/16 0012 09/04/16 1556 09/05/16 1034 09/06/16 0452  WBC 10.7* 8.1 5.2 6.4  NEUTROABS 6.9 3.9 2.7  --   HGB 10.2* 9.4* 9.0* 9.1*  HCT 32.9* 31.4* 30.0* 30.0*  MCV 77.0* 77.9* 78.1 79.4  PLT 459* 411* 327 353   Basic Metabolic Panel:  Recent Labs Lab 09/04/16 0012 09/04/16 1556 09/05/16 1034 09/06/16 0452  NA 137 138 137 140  K 3.4* 3.5 3.7 3.6  CL 107 106 108 108  CO2 22 25 24 27   GLUCOSE 97 94 85 91  BUN 11 10 6 7   CREATININE 0.32* <0.30* <0.30* <0.30*  CALCIUM 8.4* 8.4* 8.2* 8.5*  MG  --   --  1.8  --    GFR: CrCl cannot be calculated (This lab value cannot be used to calculate CrCl because it is not a number: <0.30). Liver Function Tests:  Recent Labs Lab 09/04/16 0012 09/04/16 1556  AST 11* 12*  ALT 8* 6*  ALKPHOS 64 62  BILITOT 0.4 0.3  PROT 8.1 7.7  ALBUMIN 3.2* 3.1*   No results for input(s): LIPASE, AMYLASE in the last 168 hours. No results for input(s): AMMONIA in the last 168 hours. Coagulation Profile: No results for input(s): INR, PROTIME in the last 168 hours. Cardiac Enzymes: No results for input(s): CKTOTAL, CKMB, CKMBINDEX, TROPONINI in the last 168 hours. BNP (last 3 results) No results for input(s): PROBNP in the last 8760 hours. HbA1C: No results for input(s): HGBA1C in the last 72 hours. CBG: No results for input(s): GLUCAP in the last 168 hours. Lipid Profile: No results for input(s): CHOL, HDL, LDLCALC, TRIG, CHOLHDL, LDLDIRECT in the last 72 hours. Thyroid Function Tests:  Recent Labs  09/04/16 1556  TSH  2.004   Anemia Panel:  Recent Labs  09/04/16 1556  VITAMINB12 146*  FOLATE 7.3  FERRITIN 100  TIBC 230*  IRON 10*  RETICCTPCT 0.9   Sepsis Labs:  Recent Labs Lab 09/04/16 0021  LATICACIDVEN 1.06    Recent Results (from the past 240 hour(s))  Blood culture (routine x 2)     Status: None (Preliminary result)   Collection Time: 09/04/16 12:12 AM  Result Value Ref Range Status   Specimen Description BLOOD BLOOD RIGHT FOREARM  Final   Special Requests IN PEDIATRIC BOTTLE 1CC  Final   Culture   Final    NO GROWTH 1 DAY Performed at [facility]    Report Status PENDING  Incomplete  Blood culture (routine x 2)     Status: None (Preliminary result)   Collection Time: 09/04/16 12:23  AM  Result Value Ref Range Status   Specimen Description BLOOD RIGHT ANTECUBITAL  Final   Special Requests BOTTLES DRAWN AEROBIC AND ANAEROBIC 5CC  Final   Culture   Final    NO GROWTH 1 DAY Performed at [facility]    Report Status PENDING  Incomplete  Wound or Superficial Culture     Status: None (Preliminary result)   Collection Time: 09/05/16 11:00 AM  Result Value Ref Range Status   Specimen Description WOUND LT HIP  Final   Special Requests NONE  Final   Gram Stain   Final    FEW WBC PRESENT, PREDOMINANTLY MONONUCLEAR RARE GRAM NEGATIVE RODS Performed at [facility]    Culture PENDING  Incomplete   Report Status PENDING  Incomplete         Radiology Studies: Mr Hip Left Wo Contrast  Result Date: 09/04/2016 CLINICAL DATA:  Left ischial decubitus ulcer. Assessment for worsening. EXAM: MR OF THE LEFT HIP WITHOUT CONTRAST TECHNIQUE: Multiplanar, multisequence MR imaging was performed. No intravenous contrast was administered. COMPARISON:  08/20/2016 and 08/03/2016 FINDINGS: Bones: Similar appearance of chronic osteomyelitis involving the left ischium and posterior wall left acetabulum and with extensive involvement and destruction of the left inferior pubic  ramus. Small focal area of abnormal osseous edema favoring chronic osteomyelitis posteriorly in the right ischium, new compared to the prior exam. Exaggerated lumbosacral carrying angle Articular cartilage and labrum Articular cartilage:  Degenerative chondral thinning in both hips. Labrum:  No definite tear seen. Joint or bursal effusion Joint effusion: Bilateral hip joint effusions. Suspected gas density in the right hip joint. Bilateral draining sinus tracks from the lateral hips extending to the lateral cutaneous margins. Bursae:  Small amount of right trochanteric bursitis. Muscles and tendons Muscles and tendons: Phlegmon and probably draining sinus tract extending through the right gluteal musculature laterally. Similar finding on the left. Abnormal edema in the left hip adductor musculature, left operator internus, and left proximal hamstring musculature, roughly similar to the prior exam. Other findings Miscellaneous: Reactive adenopathy along the pelvic sidewalls and inguinal regions. Inflammatory edema extends into the left perineum and left labia majora. This is similar to the prior exam. IMPRESSION: 1. Similar appearance of chronic osteomyelitis involving the left ischium and with destructive findings involving the left inferior pubic ramus. Large ulceration below the left ischium extending nearly to the bony margin. 2. New focal edema posteriorly in the right ischium may reflect early chronic osteomyelitis. 3. Bilateral hip joint effusions with suspected draining sinus tracks laterally along both hips. Reactive adenopathy in the pelvis. 4. 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 Electronically Signed   By: Gaylyn Rong M.D.   On: 09/04/2016 15:33        Scheduled Meds: .  ceFAZolin (ANCEF) IV  2 g Intravenous Q8H  . cyanocobalamin  1,000 mcg  Intramuscular Daily  . enoxaparin (LOVENOX) injection  40 mg Subcutaneous Q24H  . iron polysaccharides  150 mg Oral Daily   Continuous Infusions:   LOS: 2 days    Time spent: 35 minutes.    [facility], MD [facility] Pager [phone]  If 7PM-7AM, please contact night-coverage [email or web address] Password [facility] 09/06/2016, 12:04 PM

## 2016-09-07 DIAGNOSIS — Z881 Allergy status to other antibiotic agents status: Secondary | ICD-10-CM

## 2016-09-07 DIAGNOSIS — Z87891 Personal history of nicotine dependence: Secondary | ICD-10-CM

## 2016-09-07 DIAGNOSIS — G8220 Paraplegia, unspecified: Secondary | ICD-10-CM

## 2016-09-07 DIAGNOSIS — B965 Pseudomonas (aeruginosa) (mallei) (pseudomallei) as the cause of diseases classified elsewhere: Secondary | ICD-10-CM

## 2016-09-07 DIAGNOSIS — M86652 Other chronic osteomyelitis, left thigh: Secondary | ICD-10-CM

## 2016-09-07 DIAGNOSIS — Z833 Family history of diabetes mellitus: Secondary | ICD-10-CM

## 2016-09-07 DIAGNOSIS — Z880 Allergy status to penicillin: Secondary | ICD-10-CM

## 2016-09-07 DIAGNOSIS — Z885 Allergy status to narcotic agent status: Secondary | ICD-10-CM

## 2016-09-07 DIAGNOSIS — Z888 Allergy status to other drugs, medicaments and biological substances status: Secondary | ICD-10-CM

## 2016-09-07 DIAGNOSIS — M8668 Other chronic osteomyelitis, other site: Secondary | ICD-10-CM

## 2016-09-07 LAB — CBC
HEMATOCRIT: 30 % — AB (ref 36.0–46.0)
Hemoglobin: 9 g/dL — ABNORMAL LOW (ref 12.0–15.0)
MCH: 23.6 pg — AB (ref 26.0–34.0)
MCHC: 30 g/dL (ref 30.0–36.0)
MCV: 78.5 fL (ref 78.0–100.0)
Platelets: 374 10*3/uL (ref 150–400)
RBC: 3.82 MIL/uL — ABNORMAL LOW (ref 3.87–5.11)
RDW: 18.1 % — AB (ref 11.5–15.5)
WBC: 5.5 10*3/uL (ref 4.0–10.5)

## 2016-09-07 LAB — BASIC METABOLIC PANEL
ANION GAP: 6 (ref 5–15)
BUN: 9 mg/dL (ref 6–20)
CALCIUM: 8.2 mg/dL — AB (ref 8.9–10.3)
CO2: 26 mmol/L (ref 22–32)
Chloride: 107 mmol/L (ref 101–111)
GLUCOSE: 102 mg/dL — AB (ref 65–99)
Potassium: 3.7 mmol/L (ref 3.5–5.1)
Sodium: 139 mmol/L (ref 135–145)

## 2016-09-07 LAB — URINE CULTURE: Culture: NO GROWTH

## 2016-09-07 MED ORDER — MORPHINE SULFATE 15 MG PO TABS: 15.0000 mg | ORAL_TABLET | ORAL | Status: DC | PRN

## 2016-09-07 MED ORDER — METHOCARBAMOL 500 MG PO TABS
500.0000 mg | ORAL_TABLET | Freq: Four times a day (QID) | ORAL | Status: DC | PRN
  Administered 2016-09-07 – 2016-09-08 (×4): 500 mg via ORAL
  Filled 2016-09-07 (×4): qty 1

## 2016-09-07 MED ORDER — DEXTROSE 5 % IV SOLN
2.0000 g | Freq: Three times a day (TID) | INTRAVENOUS | Status: DC
  Administered 2016-09-08 – 2016-09-09 (×5): 2 g via INTRAVENOUS
  Filled 2016-09-07 (×7): qty 2

## 2016-09-07 MED ORDER — POLYETHYLENE GLYCOL 3350 17 G PO PACK
17.0000 g | PACK | Freq: Every day | ORAL | Status: DC
  Administered 2016-09-07: 17 g via ORAL
  Filled 2016-09-07: qty 1

## 2016-09-07 MED ORDER — OXYCODONE HCL 5 MG PO TABS
5.0000 mg | ORAL_TABLET | ORAL | Status: DC | PRN
  Administered 2016-09-07 – 2016-09-08 (×4): 10 mg via ORAL
  Filled 2016-09-07 (×4): qty 2

## 2016-09-07 MED ORDER — BISACODYL 5 MG PO TBEC
5.0000 mg | DELAYED_RELEASE_TABLET | Freq: Every day | ORAL | Status: DC | PRN
Start: 2016-09-07 — End: 2016-09-09

## 2016-09-07 MED ORDER — DIPHENHYDRAMINE HCL 25 MG PO CAPS
25.0000 mg | ORAL_CAPSULE | Freq: Four times a day (QID) | ORAL | Status: DC | PRN
  Administered 2016-09-07 – 2016-09-08 (×4): 25 mg via ORAL
  Filled 2016-09-07 (×4): qty 1

## 2016-09-07 NOTE — Progress Notes (Signed)
CENTRAL  [facility]ERY  [address] [place], [place] [address] Phone: [phone] FAX: [place] [phone] 01/24/1986    Problem List:   Principal Problem:   Chronic osteomyelitis [facility]) Active Problems:   Lower urinary tract infectious disease   Amputee, below knee, left ([place])   Amputee, below knee, right ([place])   Decubitus ulcer of ischial area, left, stage IV (HCC)   B12 deficiency   Anemia, iron deficiency   Urinary tract infection without hematuria           Assessment  Chronic decubitus of initial tuberosity and in paraplegic patient.  Good granulation.  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   Plan:  -I did chlorhexidine based dressing today just to help lower the colonization wound but otherwise it looks great.  She had an successful gluteus muscle flap on the right hip.  Prep she could be Candida in the left side of infection clears.  Would defer back to [facility] where they have been managing this for quite some time.  Urinary diversion makes sense that she has urinated tract infections and urinary incontinence.  We will defer to urology.  She has no fecal incontinence or contamination of wound, so I do not think fecal diversion is warranted at this time.  She is not interested in it.    Defer to infectious disease & ortho on chronic left ischial osteomyelitis.  Tentatively planning to do another two weeks of IV antibiotics  -VTE prophylaxis- SCDs, etc -mobilize as tolerated to help recovery  The patient is stable.  There is no evidence of peritonitis, acute abdomen, nor shock.  There is no strong evidence of failure of improvement nor decline with current non-operative management.  There is no need for surgery at the present moment.  We will be available as needed.  I see no need for any debridement.  There is no fasciitis.  There is no necrosis.    Adin Hector, M.D.,  F.A.C.S. Gastrointestinal and Minimally Invasive Surgery [facility] [address] [place], [place] [address] [phone] Main / Paging   09/07/2016  CARE TEAM:  PCP: PROVIDER NOT IN SYSTEM  Outpatient Care Team: Patient Care Team: Provider Not In System as PCP - General  Inpatient Treatment Team: Treatment Team: Attending Provider: Eugenie Filler, MD; Rounding Team: Fatima Blank, MD; Registered Nurse: Dorita Sciara, RN; Registered Nurse: Oleta Mouse, RN; Technician: Sueanne Margarita, NT; Consulting Physician: Nolon Nations, MD; Technician: Resa Miner, NT  Subjective:  Sitting up in NAD  Still soreness near.  Concern for some greenish drainage of the wound.  Struggles with urinary incontinence.  Requesting suprapubic tube.  No problems with fecal incontinence.  No diarrhea problems.  Wound VAC had been tried on this but it would not hold her seal.  Objective:  Vital signs:  Vitals:   09/05/16 2231 09/06/16 0718 09/06/16 1403 09/06/16 2113  BP: (!) 103/52 (!) 101/52 (!) 112/53 (!) 99/58  Pulse: 89 92 91 95  Resp: _0 Temp:  98.7 F (37.1 C) 98.2 F (36.8 C) 98.6 F (37 C)  TempSrc:  Oral Oral Oral  SpO2: 100% 100% 100% 99%  Weight:      Height:        Last BM Date: 09/02/16  Intake/Output   Yesterday:  01/11 0701 - 01/12 0700 In: 1110 [P.O.:810; IV Piggyback:300] Out: 1575 [Urine:1575] This shift:  Total I/O In: -  Out: 375 [Urine:375]  Bowel function:  Flatus: YES  BM:  YES  Drain: (No drain)   Physical Exam:  General: Pt awake/alert/oriented x4 in No acute distress Eyes: PERRL, normal EOM.  Sclera clear.  No icterus Neuro: CN II-XII intact w/o focal sensory/motor deficits. Lymph: No head/neck/groin lymphadenopathy Psych:  No delerium/psychosis/paranoia HENT: Normocephalic, Mucus membranes moist.  No thrush Neck: Supple, No tracheal deviation Chest: No chest wall pain w good  excursion CV:  Pulses intact.  Regular rhythm MS: Normal AROM mjr joints.  No obvious deformity Abdomen: Soft.  Nondistended.  Nontender.  No evidence of peritonitis.  No incarcerated hernias Left lateral hip decubitus with excellent granulation tissue.  Mild Green sheen on dressings.  Washed out with chlorhexidine.  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  Good granular base.  No evidence of necrosis.  Minimal undermining.. Ext:  Bil BKA's with no active wounds.   Skin: No petechiae / purpura  Results:   Labs: Results for orders placed or performed during the hospital encounter of 09/03/16 (from the past 48 hour(s))  CBC     Status: Abnormal   Collection Time: 09/06/16  4:52 AM  Result Value Ref Range   WBC 6.4 4.0 - 10.5 K/uL   RBC 3.78 (L) 3.87 - 5.11 MIL/uL   Hemoglobin 9.1 (L) 12.0 - 15.0 g/dL   HCT 30.0 (L) 36.0 - 46.0 %   MCV 79.4 78.0 - 100.0 fL   MCH 24.1 (L) 26.0 - 34.0 pg   MCHC 30.3 30.0 - 36.0 g/dL   RDW 18.2 (H) 11.5 - 15.5 %   Platelets 353 150 - 400 K/uL  Basic metabolic panel     Status: Abnormal   Collection Time: 09/06/16  4:52 AM  Result Value Ref Range   Sodium 140 135 - 145 mmol/L   Potassium 3.6 3.5 - 5.1 mmol/L   Chloride 108 101 - 111 mmol/L   CO2 27 22 - 32 mmol/L   Glucose, Bld 91 65 - 99 mg/dL   BUN 7 6 - 20 mg/dL   Creatinine, Ser <0.30 (L) 0.44 - 1.00 mg/dL   Calcium 8.5 (L) 8.9 - 10.3 mg/dL   GFR calc non Af Amer NOT CALCULATED >60 mL/min   GFR calc Af Amer NOT CALCULATED >60 mL/min    Comment: (NOTE) The eGFR has been calculated using the CKD EPI equation. This calculation has not been validated in all clinical situations. eGFR's persistently <60 mL/min signify possible Chronic Kidney Disease.    Anion gap 5 5 - 15  CBC     Status: Abnormal   Collection Time: 09/07/16  5:16 AM  Result Value Ref Range   WBC 5.5 4.0 - 10.5 K/uL   RBC 3.82 (L) 3.87 - 5.11 MIL/uL   Hemoglobin 9.0 (L) 12.0 - 15.0 g/dL   HCT 30.0 (L) 36.0 - 46.0 %   MCV 78.5  78.0 - 100.0 fL   MCH 23.6 (L) 26.0 - 34.0 pg   MCHC 30.0 30.0 - 36.0 g/dL   RDW 18.1 (H) 11.5 - 15.5 %   Platelets 374 150 - 400 K/uL  Basic metabolic panel     Status: Abnormal   Collection Time: 09/07/16  5:16 AM  Result Value Ref Range   Sodium 139 135 - 145 mmol/L   Potassium 3.7 3.5 - 5.1 mmol/L   Chloride 107 101 - 111 mmol/L   CO2 26 22 - 32 mmol/L   Glucose, Bld 102 (H) 65 -  99 mg/dL   BUN 9 6 - 20 mg/dL   Creatinine, Ser <0.30 (L) 0.44 - 1.00 mg/dL   Calcium 8.2 (L) 8.9 - 10.3 mg/dL   GFR calc non Af Amer NOT CALCULATED >60 mL/min   GFR calc Af Amer NOT CALCULATED >60 mL/min    Comment: (NOTE) The eGFR has been calculated using the CKD EPI equation. This calculation has not been validated in all clinical situations. eGFR's persistently <60 mL/min signify possible Chronic Kidney Disease.    Anion gap 6 5 - 15    Imaging / Studies: No results found.  Medications / Allergies: per chart  Antibiotics: Anti-infectives    Start     Dose/Rate Route Frequency Ordered Stop   09/04/16 0600  ceFAZolin (ANCEF) IVPB 2g/100 mL premix     2 g 200 mL/hr over 30 Minutes Intravenous Every 8 hours 09/04/16 0508     09/04/16 0200  ceFAZolin (ANCEF) IVPB 1 g/50 mL premix     1 g 100 mL/hr over 30 Minutes Intravenous  Once 09/04/16 0156 09/04/16 0525        Note: Portions of this report may have been transcribed using voice recognition software. Every effort was made to ensure accuracy; however, inadvertent computerized transcription errors may be present.   Any transcriptional errors that result from this process are unintentional.     Adin Hector, M.D., F.A.C.S. Gastrointestinal and Minimally Invasive Surgery [facility] [address], [place], [place] [address] [phone] Main / Paging   09/07/2016

## 2016-09-07 NOTE — Consult Note (Addendum)
[place] for Infectious Disease  Total days of antibiotics 4        Day 4 cefazolin               Reason for Consult:acute on chronic ischial osteo   Referring Physician: Grandville Silos  Principal Problem:   Chronic osteomyelitis of hip ([place]) Active Problems:   Lower urinary tract infectious disease   S/P bilateral BKA (below knee amputation) ([place])   Amputee, below knee, right ([place])   Decubitus ulcer of ischial area, left, stage IV (HCC)   B12 deficiency   Anemia, iron deficiency   Urinary tract infection without hematuria    HPI: Kathryn Ewing is a 31 y.o. female  with history of T9 paraplegia, bilateraly BKA 2/2 history of chronic osteomyelitis s/p flap graft. She has had an ischial ulcer developing since September but now has associated osteo to that area. She was treated with 6 wk of cefazolin through [facility] [place]. She relocated to [facility] in the winter. She was hospitalized in early December for worsening drainage from her wounds with subjective fevers, elevated inflammatory markers. She was seen by my partner, Dr Linus Salmons who recommended to 2 addn weeks of treatment which would have been up until 12/22, with follow up.  On 1/9, she presented to the ED for worsening pain and discharge from the left ischial decubitus ulcer x 1 wk. with subjective feeling of fever and chills.On physical exam, she had greenish discharge from the left ischial decubitus ulcer. Patient was mildly tachycardic. She was admitted for further management of possible acute on chronic osteomyelitis. Her inflammatory markers were unchanged with Sed rate of 82, previously 92 in December. Mildly elevated wbc of 10K no left shift.  MRI shows chronic osteo of left ischium with destructive findings involving left inferior pubic ramus and large ulceration below left ischium extending near bony margin. There is new edema near right ischium ? Early osteo. Some myositis.This study is similar to 1 month ago. Essentially unchanged  per my review.  She has been seen by general surgery who felt she didn't need any debridement for chronic decub of ischial tuberosity since it had good granulation bed. She had chlorhexedine based dressing which cleared biofilm/probable PsA colonization (noted on wound superficial cx). No need for fecal diversion so no contamination of wound. They deferred commenting on urinary diversion to urology. They recommended 2 wk of IV abtx plus wound care management. Her cx for blood NGTD, wound cx show few PsA and proteus  Past Medical History:  Diagnosis Date  . Amputee   . Amputee, below knee, left ([place])   . Amputee, below knee, right ([place])   . Anemia, iron deficiency 09/05/2016  . B12 deficiency 09/05/2016  . Osteomyelitis ([place])   . S/P flap graft     Allergies:  Allergies  Allergen Reactions  . Tramadol Nausea And Vomiting, Rash and Other (See Comments)    New allergy Blistering rash, also cause vomiting Vision changes   . Amoxicillin Nausea And Vomiting and Other (See Comments)    Has patient had a PCN reaction causing immediate rash, facial/tongue/throat swelling, SOB or lightheadedness with hypotension: No Has patient had a PCN reaction causing severe rash involving mucus membranes or skin necrosis: No Has patient had a PCN reaction that required hospitalization No Has patient had a PCN reaction occurring within the last 10 years: Yes If all of the above answers are "NO", then may proceed with Cephalosporin use.  . Codeine  Nausea And Vomiting  . Hydrocodone Nausea And Vomiting  . Meropenem Nausea And Vomiting  . Metronidazole Nausea And Vomiting  . Oxycodone-Acetaminophen Itching  . Piperacillin Sod-Tazobactam So Nausea And Vomiting  . Promethazine Nausea And Vomiting  . Diazepam Rash     MEDICATIONS: .  ceFAZolin (ANCEF) IV  2 g Intravenous Q8H  . cyanocobalamin  1,000 mcg Intramuscular Daily  . enoxaparin (LOVENOX) injection  40 mg Subcutaneous Q24H  . iron polysaccharides   150 mg Oral Daily  . polyethylene glycol  17 g Oral Daily    Social History  Substance Use Topics  . Smoking status: Former Research scientist (life sciences)  . Smokeless tobacco: Never Used  . Alcohol use No    Family History  Problem Relation Age of Onset  . Diabetes Mellitus II Other      Review of Systems  Constitutional: Negative for fever, chills, diaphoresis, activity change, appetite change, fatigue and unexpected weight change.  HENT: Negative for congestion, sore throat, rhinorrhea, sneezing, trouble swallowing and sinus pressure.  Eyes: Negative for photophobia and visual disturbance.  Respiratory: Negative for cough, chest tightness, shortness of breath, wheezing and stridor.  Cardiovascular: Negative for chest pain, palpitations and leg swelling.  Gastrointestinal: Negative for nausea, vomiting, abdominal pain, diarrhea, constipation, blood in stool, abdominal distention and anal bleeding.  Genitourinary: Negative for dysuria, hematuria, flank pain and difficulty urinating.  Musculoskeletal: Negative for myalgias, back pain, joint swelling, arthralgias and gait problem.  Skin: + ischial wound. Negative for color change, pallor, rash and wound.  Neurological: Negative for dizziness, tremors, weakness and light-headedness.  Hematological: Negative for adenopathy. Does not bruise/bleed easily.  Psychiatric/Behavioral: Negative for behavioral problems, confusion, sleep disturbance, dysphoric mood, decreased concentration and agitation.     OBJECTIVE: Temp:  [98.3 F (36.8 C)-98.6 F (37 C)] 98.3 F (36.8 C) (01/12 1424) Pulse Rate:  [69-95] 69 (01/12 1424) Resp:  [16] 16 (01/12 1424) BP: (95-99)/(58-62) 95/62 (01/12 1424) SpO2:  [99 %-100 %] 100 % (01/12 1424) Physical Exam  Constitutional:  oriented to person, place, and time. appears well-developed and well-nourished. No distress.  HENT: [place]/AT, PERRLA, no scleral icterus Mouth/Throat: Oropharynx is clear and moist. No oropharyngeal  exudate.  Cardiovascular: Normal rate, regular rhythm and normal heart sounds. Exam reveals no gallop and no friction rub.  No murmur heard.  Pulmonary/Chest: Effort normal and breath sounds normal. No respiratory distress.  has no wheezes.  Neck = supple, no nuchal rigidity Abdominal: Soft. Bowel sounds are normal.  exhibits no distension. There is no tenderness.  Lymphadenopathy: no cervical adenopathy. No axillary adenopathy Neurological: alert and oriented to person, place, and time.  Skin: wound is wrapped.pt deferred exam. Left sacral wound good granulation tissue (see picture from 1/11 general surgery consult) Psychiatric: a normal mood and affect.  behavior is normal.  Ext: bilateral bka   LABS: Results for orders placed or performed during the hospital encounter of 09/03/16 (from the past 48 hour(s))  CBC     Status: Abnormal   Collection Time: 09/06/16  4:52 AM  Result Value Ref Range   WBC 6.4 4.0 - 10.5 K/uL   RBC 3.78 (L) 3.87 - 5.11 MIL/uL   Hemoglobin 9.1 (L) 12.0 - 15.0 g/dL   HCT 30.0 (L) 36.0 - 46.0 %   MCV 79.4 78.0 - 100.0 fL   MCH 24.1 (L) 26.0 - 34.0 pg   MCHC 30.3 30.0 - 36.0 g/dL   RDW 18.2 (H) 11.5 - 15.5 %   Platelets 353 150 -  400 K/uL  Basic metabolic panel     Status: Abnormal   Collection Time: 09/06/16  4:52 AM  Result Value Ref Range   Sodium 140 135 - 145 mmol/L   Potassium 3.6 3.5 - 5.1 mmol/L   Chloride 108 101 - 111 mmol/L   CO2 27 22 - 32 mmol/L   Glucose, Bld 91 65 - 99 mg/dL   BUN 7 6 - 20 mg/dL   Creatinine, Ser <0.30 (L) 0.44 - 1.00 mg/dL   Calcium 8.5 (L) 8.9 - 10.3 mg/dL   GFR calc non Af Amer NOT CALCULATED >60 mL/min   GFR calc Af Amer NOT CALCULATED >60 mL/min    Comment: (NOTE) The eGFR has been calculated using the CKD EPI equation. This calculation has not been validated in all clinical situations. eGFR's persistently <60 mL/min signify possible Chronic Kidney Disease.    Anion gap 5 5 - 15  Culture, Urine     Status:  None   Collection Time: 09/06/16 11:00 AM  Result Value Ref Range   Specimen Description URINE, RANDOM    Special Requests NONE    Culture NO GROWTH Performed at [facility]     Report Status 09/07/2016 FINAL   CBC     Status: Abnormal   Collection Time: 09/07/16  5:16 AM  Result Value Ref Range   WBC 5.5 4.0 - 10.5 K/uL   RBC 3.82 (L) 3.87 - 5.11 MIL/uL   Hemoglobin 9.0 (L) 12.0 - 15.0 g/dL   HCT 30.0 (L) 36.0 - 46.0 %   MCV 78.5 78.0 - 100.0 fL   MCH 23.6 (L) 26.0 - 34.0 pg   MCHC 30.0 30.0 - 36.0 g/dL   RDW 18.1 (H) 11.5 - 15.5 %   Platelets 374 150 - 400 K/uL  Basic metabolic panel     Status: Abnormal   Collection Time: 09/07/16  5:16 AM  Result Value Ref Range   Sodium 139 135 - 145 mmol/L   Potassium 3.7 3.5 - 5.1 mmol/L   Chloride 107 101 - 111 mmol/L   CO2 26 22 - 32 mmol/L   Glucose, Bld 102 (H) 65 - 99 mg/dL   BUN 9 6 - 20 mg/dL   Creatinine, Ser <0.30 (L) 0.44 - 1.00 mg/dL   Calcium 8.2 (L) 8.9 - 10.3 mg/dL   GFR calc non Af Amer NOT CALCULATED >60 mL/min   GFR calc Af Amer NOT CALCULATED >60 mL/min    Comment: (NOTE) The eGFR has been calculated using the CKD EPI equation. This calculation has not been validated in all clinical situations. eGFR's persistently <60 mL/min signify possible Chronic Kidney Disease.    Anion gap 6 5 - 15    MICRO: 1/10 PsA, proteus (sensitivities pending) IMAGING: IMPRESSION: 1. Similar appearance of chronic osteomyelitis involving the left ischium and with destructive findings involving the left inferior pubic ramus. Large ulceration below the left ischium extending nearly to the bony margin. 2. New focal edema posteriorly in the right ischium may reflect early chronic osteomyelitis. 3. Bilateral hip joint effusions with suspected draining sinus tracks laterally along both hips. Reactive adenopathy in the pelvis. 4. 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  Assessment/Plan:  31yo F with T10 paraplegia and chronic ischial osteo but subacute on chronic osteo to left ischium with possible new area on right ischium. Cultures showing PsA colonization.  Previously had not been on any pseudomonal coverage. Inflammatory markers unchanged in 1 month despite receiving abtx for 1/2 that time.  - recommend to do 4 wk with cefepime 2gm IV Q 8hr. Start here to see if she tolerates it.  - await isolate sensitivities to determine if this will be final recs - dr Lucianne Lei dam to provide change in abtx if needed - we will see back in the ID clinic in 2-3wk - agree with her getting wound care - see if there are any other resources for her to get airmattress bed -she states that her insurance won't pay for it or only for limited time period. - possible port malfunction = she required TPA during this admission but thus far appears to be working sufficiently   Dr Tommy Medal available for questions over the weekend. I will see her on Monday if still admitted  --home health orders listed below--- Diagnosis: Ischial osteo  Culture Result: pseudomonas  Allergies  Allergen Reactions  . Tramadol Nausea And Vomiting, Rash and Other (See Comments)    New allergy Blistering rash, also cause vomiting Vision changes   . Amoxicillin Nausea And Vomiting and Other (See Comments)    Has patient had a PCN reaction causing immediate rash, facial/tongue/throat swelling, SOB or lightheadedness with hypotension: No Has patient had a PCN reaction causing severe rash involving mucus membranes or skin necrosis: No Has patient had a PCN reaction that required hospitalization No Has patient had a PCN reaction occurring within the last 10 years: Yes If all of the above answers are "NO", then may proceed with Cephalosporin use.  . Codeine Nausea And Vomiting  . Hydrocodone Nausea And Vomiting  . Meropenem Nausea  And Vomiting  . Metronidazole Nausea And Vomiting  . Oxycodone-Acetaminophen Itching  . Piperacillin Sod-Tazobactam So Nausea And Vomiting  . Promethazine Nausea And Vomiting  . Diazepam Rash    Discharge antibiotics: Per pharmacy protocol  cefepime  Duration: 4 wk End Date: Feb 10th  [place] Per Protocol:  Labs weekly while on IV antibiotics: _x_ CBC with differential __x BMP  _x_ CRP _x_ ESR  _X_  Weekly port dressing changes  Fax weekly labs to [phone]  Clinic Follow Up Appt: 2-3 wk  @[place]  or [facility] [place]

## 2016-09-07 NOTE — Progress Notes (Signed)
Appointment on 09/19/16 12:30 at [facility].

## 2016-09-07 NOTE — Progress Notes (Signed)
PROGRESS NOTE    Kathryn Ewing  ZOX:[id number] DOB: Mar 26, 1986 DOA: 09/03/2016 PCP: PROVIDER NOT IN SYSTEM    Brief Narrative:  31 year old female with history of bilateral BKA secondary to osteomyelitis, paraplegia, history of chronic osteomyelitis we'll use to get her care at [facility] and currently has moved to [place] admitted last month with osteomyelitis of the left ischial area and was discharged on 2 more weeks of IV Ancef. Patient presented to the ED with worsening pain and discharge from the left ischial decubitus ulcer. Patient empirically on IV Ancef. MRI of the left hip obtained. Wound care following.   Assessment & Plan:   Principal Problem:   Chronic osteomyelitis of hip (HCC) Active Problems:   Lower urinary tract infectious disease   S/P bilateral BKA (below knee amputation) (HCC)   Amputee, below knee, right (HCC)   Decubitus ulcer of ischial area, left, stage IV (HCC)   B12 deficiency   Anemia, iron deficiency   Urinary tract infection without hematuria  #1 decubitus ulcer of the ischial area Stage 4 Patient had presented with a history of chronic osteomyelitis and recent treatment of left issue chronic osteomyelitis with Ancef for about 6-8 weeks who presented to the ED with worsening pain and discharge from the left ischial decubitus ulcer. Patient had some subjective fevers and chills. Patient noted to be afebrile. Patient with normal white count. Patient stated recently finished 2 weeks of IV antibiotics from prior discharge. MRI of the left hip with similar appearance of chronic osteomyelitis involving the left issue with destructive findings involving the left inferior pubic ramus. Large ulceration below left ischial extending nearly to the bone margin. New focal edema posteriorly in the right issue may reflect early chronic osteomyelitis. Bilateral hip joint effusions with suspected draining sinus tracts laterally along both hips. 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  Patient currently on IV Ancef. Will likely need outpatient wound care. Patient has been seen in consultation by general surgery who has assessed patient's sacral wound in 2 days no evidence of major necrosis, cellulitis, or fasciitis. Air overlay mattress recommended which has been placed. Will discuss with ID in terms of continuation of IV antibiotics.  # 2 B 12 deficiency B 12 1000 MCG's IM daily 1 week, and then every weekly 1 month, and then monthly. Patient will need outpatient follow-up.  #3 T9 paraplegia  #4 iron deficiency anemia Hemoglobin currently stable at 9. Continue oral iron supplementation.  #5 bacteriuria. Urine cultures negative. IV Ancef.  #6 constipation Placed on MiraLAX daily. Dulcolax as needed.    DVT prophylaxis: Lovenox. Code Status: Full Family Communication: Updated patient no family at bedside. Disposition Plan: Home when medically stable.   Consultants:   Wound Care nurse.  Gen. surgery: Dr. Michaell Cowing 09/06/2016  Curb sided urology Dr. Mena Goes 09/06/2016  Procedures:   MRI left hip 09/04/2016  Antimicrobials:  IV Ancef 09/04/2016   Subjective: Sitting laying down complaining of left hip pain. Patient more pleasant today. Patient denies any chest pain. No shortness of breath. Patient with complaints of constipation.  Objective: Vitals:   09/05/16 2231 09/06/16 0718 09/06/16 1403 09/06/16 2113  BP: (!) 103/52 (!) 101/52 (!) 112/53 (!) 99/58  Pulse: 89 92 91 95  Resp: 16 16 16 16   Temp:  98.7 F (37.1 C) 98.2 F (36.8 C) 98.6 F (37 C)  TempSrc:  Oral Oral Oral  SpO2: 100% 100% 100% 99%  Weight:      Height:  Intake/Output Summary (Last 24 hours) at 09/07/16 1334 Last data filed at 09/07/16 1030  Gross per 24 hour  Intake             1370 ml  Output             1950 ml  Net             -580 ml   Filed Weights   09/03/16 1335  Weight: 65.8 kg (145 lb)    Examination:  General exam: Appears calm and comfortable    Respiratory system: Clear to auscultation. Respiratory effort normal. Cardiovascular system: S1 & S2 heard, RRR. No JVD, murmurs, rubs, gallops or clicks. No pedal edema. Gastrointestinal system: Abdomen is nondistended, soft and nontender. No organomegaly or masses felt. Normal bowel sounds heard. Central nervous system: Alert and oriented. No focal neurological deficits. Extremities: Bilateral lower extremity amputee Skin: Patient with a left ischial area stage IV decubitus. No rashes, lesions or ulcers Psychiatry: Judgement and insight appear normal. Mood & affect appropriate.     Data Reviewed: I have personally reviewed following labs and imaging studies  CBC:  Recent Labs Lab 09/04/16 0012 09/04/16 1556 09/05/16 1034 09/06/16 0452 09/07/16 0516  WBC 10.7* 8.1 5.2 6.4 5.5  NEUTROABS 6.9 3.9 2.7  --   --   HGB 10.2* 9.4* 9.0* 9.1* 9.0*  HCT 32.9* 31.4* 30.0* 30.0* 30.0*  MCV 77.0* 77.9* 78.1 79.4 78.5  PLT 459* 411* 327 353 374   Basic Metabolic Panel:  Recent Labs Lab 09/04/16 0012 09/04/16 1556 09/05/16 1034 09/06/16 0452 09/07/16 0516  NA 137 138 137 140 139  K 3.4* 3.5 3.7 3.6 3.7  CL 107 106 108 108 107  CO2 22 25 24 27 26   GLUCOSE 97 94 85 91 102*  BUN 11 10 6 7 9   CREATININE 0.32* <0.30* <0.30* <0.30* <0.30*  CALCIUM 8.4* 8.4* 8.2* 8.5* 8.2*  MG  --   --  1.8  --   --    GFR: CrCl cannot be calculated (This lab value cannot be used to calculate CrCl because it is not a number: <0.30). Liver Function Tests:  Recent Labs Lab 09/04/16 0012 09/04/16 1556  AST 11* 12*  ALT 8* 6*  ALKPHOS 64 62  BILITOT 0.4 0.3  PROT 8.1 7.7  ALBUMIN 3.2* 3.1*   No results for input(s): LIPASE, AMYLASE in the last 168 hours. No results for input(s): AMMONIA in the last 168 hours. Coagulation Profile: No results for input(s): INR, PROTIME in the last 168 hours. Cardiac Enzymes: No results for input(s): CKTOTAL, CKMB, CKMBINDEX, TROPONINI in the last 168  hours. BNP (last 3 results) No results for input(s): PROBNP in the last 8760 hours. HbA1C: No results for input(s): HGBA1C in the last 72 hours. CBG: No results for input(s): GLUCAP in the last 168 hours. Lipid Profile: No results for input(s): CHOL, HDL, LDLCALC, TRIG, CHOLHDL, LDLDIRECT in the last 72 hours. Thyroid Function Tests:  Recent Labs  09/04/16 1556  TSH 2.004   Anemia Panel:  Recent Labs  09/04/16 1556  VITAMINB12 146*  FOLATE 7.3  FERRITIN 100  TIBC 230*  IRON 10*  RETICCTPCT 0.9   Sepsis Labs:  Recent Labs Lab 09/04/16 0021  LATICACIDVEN 1.06    Recent Results (from the past 240 hour(s))  Blood culture (routine x 2)     Status: None (Preliminary result)   Collection Time: 09/04/16 12:12 AM  Result Value Ref Range Status   Specimen  Description BLOOD BLOOD RIGHT FOREARM  Final   Special Requests IN PEDIATRIC BOTTLE 1CC  Final   Culture   Final    NO GROWTH 2 DAYS Performed at [facility]    Report Status PENDING  Incomplete  Blood culture (routine x 2)     Status: None (Preliminary result)   Collection Time: 09/04/16 12:23 AM  Result Value Ref Range Status   Specimen Description BLOOD RIGHT ANTECUBITAL  Final   Special Requests BOTTLES DRAWN AEROBIC AND ANAEROBIC 5CC  Final   Culture   Final    NO GROWTH 2 DAYS Performed at [facility]    Report Status PENDING  Incomplete  Wound or Superficial Culture     Status: None (Preliminary result)   Collection Time: 09/05/16 11:00 AM  Result Value Ref Range Status   Specimen Description WOUND LT HIP  Final   Special Requests NONE  Final   Gram Stain   Final    FEW WBC PRESENT, PREDOMINANTLY MONONUCLEAR RARE GRAM NEGATIVE RODS    Culture   Final    FEW PSEUDOMONAS AERUGINOSA FEW PROTEUS MIRABILIS SUSCEPTIBILITIES TO FOLLOW Performed at [facility]    Report Status PENDING  Incomplete  Culture, Urine     Status: None   Collection Time: 09/06/16 11:00 AM  Result Value  Ref Range Status   Specimen Description URINE, RANDOM  Final   Special Requests NONE  Final   Culture NO GROWTH Performed at [facility]   Final   Report Status 09/07/2016 FINAL  Final         Radiology Studies: No results found.      Scheduled Meds: .  ceFAZolin (ANCEF) IV  2 g Intravenous Q8H  . cyanocobalamin  1,000 mcg Intramuscular Daily  . enoxaparin (LOVENOX) injection  40 mg Subcutaneous Q24H  . iron polysaccharides  150 mg Oral Daily  . polyethylene glycol  17 g Oral Daily   Continuous Infusions:   LOS: 3 days    Time spent: 35 minutes.    [facility] Center[facility]OMPS[facility],Jonothan Heberle, MD [facility] Pager [phone]  If 7PM-7AM, please contact night-coverage [email or web address] Password [email or web address] 09/07/2016, 1:34 PM

## 2016-09-07 NOTE — Progress Notes (Signed)
Pharmacy Antibiotic Note  Kathryn Ewing is a 31 y.o. female with paraplegia s/p bilateral BKA admitted on 09/03/2016 with chronic ischial osteomyelitis, decubitus ulcer, and UTI.  Pharmacy has been consulted for Cefepime dosing per ID recommendations for 4 weeks therapy.  Plan:  ID recommends Cefepime 2g IV q8h for Pseudomonas, since renal function is adequate - agree with this dose.  Pharmacy will sign off dosing per protocol at this time.  Height: 4\' 4"  (132.1 cm) Weight: 145 lb (65.8 kg) IBW/kg (Calculated) : 27.1  Temp (24hrs), Avg:98.5 F (36.9 C), Min:98.3 F (36.8 C), Max:98.6 F (37 C)   Recent Labs Lab 09/04/16 0012 09/04/16 0021 09/04/16 1556 09/05/16 1034 09/06/16 0452 09/07/16 0516  WBC 10.7*  --  8.1 5.2 6.4 5.5  CREATININE 0.32*  --  <0.30* <0.30* <0.30* <0.30*  LATICACIDVEN  --  1.06  --   --   --   --     CrCl cannot be calculated (This lab value cannot be used to calculate CrCl because it is not a number: <0.30).   CrCl likely > 100 ml/min with age and adequate UOP  Allergies  Allergen Reactions  . Tramadol Nausea And Vomiting, Rash and Other (See Comments)    New allergy Blistering rash, also cause vomiting Vision changes   . Amoxicillin Nausea And Vomiting and Other (See Comments)    Has patient had a PCN reaction causing immediate rash, facial/tongue/throat swelling, SOB or lightheadedness with hypotension: No Has patient had a PCN reaction causing severe rash involving mucus membranes or skin necrosis: No Has patient had a PCN reaction that required hospitalization No Has patient had a PCN reaction occurring within the last 10 years: Yes If all of the above answers are "NO", then may proceed with Cephalosporin use.  . Codeine Nausea And Vomiting  . Hydrocodone Nausea And Vomiting  . Meropenem Nausea And Vomiting  . Metronidazole Nausea And Vomiting  . Oxycodone-Acetaminophen Itching  . Piperacillin Sod-Tazobactam So Nausea And Vomiting  .  Promethazine Nausea And Vomiting  . Diazepam Rash    Antimicrobials this admission:  1/9 Cefazolin >> 1/12 1/12 Cefepime >>  Dose adjustments this admission:  ---  Microbiology results:  1/9 BCx x2: NGTD 1/9 UA: few bacteria, nitrite (+), leukocytes (+); NGF 1/10 wound cx (superficial): few GNR - few PsA and Proteus  Thank you for allowing pharmacy to be a part of this patient's care.  Kathryn Ewing, PharmD, BCPS Pager: [phone] 09/07/2016 6:05 PM

## 2016-09-08 DIAGNOSIS — M8660 Other chronic osteomyelitis, unspecified site: Secondary | ICD-10-CM

## 2016-09-08 LAB — CBC
HCT: 31 % — ABNORMAL LOW (ref 36.0–46.0)
Hemoglobin: 9.3 g/dL — ABNORMAL LOW (ref 12.0–15.0)
MCH: 23.7 pg — ABNORMAL LOW (ref 26.0–34.0)
MCHC: 30 g/dL (ref 30.0–36.0)
MCV: 79.1 fL (ref 78.0–100.0)
Platelets: 355 K/uL (ref 150–400)
RBC: 3.92 MIL/uL (ref 3.87–5.11)
RDW: 18.3 % — ABNORMAL HIGH (ref 11.5–15.5)
WBC: 6 K/uL (ref 4.0–10.5)

## 2016-09-08 LAB — BASIC METABOLIC PANEL
ANION GAP: 5 (ref 5–15)
BUN: 13 mg/dL (ref 6–20)
CO2: 24 mmol/L (ref 22–32)
Calcium: 8.5 mg/dL — ABNORMAL LOW (ref 8.9–10.3)
Chloride: 107 mmol/L (ref 101–111)
Creatinine, Ser: <0.3 mg/dL — ABNORMAL LOW (ref 0.44–1.00)
GLUCOSE: 97 mg/dL (ref 65–99)
POTASSIUM: 3.9 mmol/L (ref 3.5–5.1)
Sodium: 136 mmol/L (ref 135–145)

## 2016-09-08 MED ORDER — SENNOSIDES-DOCUSATE SODIUM 8.6-50 MG PO TABS
1.0000 | ORAL_TABLET | Freq: Two times a day (BID) | ORAL | Status: DC
  Administered 2016-09-08 – 2016-09-09 (×2): 1 via ORAL
  Filled 2016-09-08 (×3): qty 1

## 2016-09-08 MED ORDER — SORBITOL 70 % SOLN
30.0000 mL | Freq: Once | Status: DC
  Filled 2016-09-08: qty 30

## 2016-09-08 MED ORDER — POLYETHYLENE GLYCOL 3350 17 G PO PACK
17.0000 g | PACK | Freq: Two times a day (BID) | ORAL | Status: DC
  Administered 2016-09-08 – 2016-09-09 (×2): 17 g via ORAL
  Filled 2016-09-08 (×3): qty 1

## 2016-09-08 MED ORDER — OXYCODONE HCL 5 MG PO TABS
10.0000 mg | ORAL_TABLET | ORAL | Status: DC | PRN
  Administered 2016-09-08 – 2016-09-09 (×5): 10 mg via ORAL
  Filled 2016-09-08 (×5): qty 2

## 2016-09-08 NOTE — Progress Notes (Signed)
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 Patient presented to the ED with worsening pain and discharge from the left ischial decubitus ulcer. Patient empirically on IV Ancef. MRI of the left hip obtained. Wound care following.   Assessment & Plan:   Principal Problem:   Chronic osteomyelitis of hip (HCC) Active Problems:   Lower urinary tract infectious disease   S/P bilateral BKA (below knee amputation) (HCC)   Amputee, below knee, right (HCC)   Decubitus ulcer of ischial area, left, stage IV (HCC)   B12 deficiency   Anemia, iron deficiency   Urinary tract infection without hematuria  #1 decubitus ulcer of the ischial area Stage 4 Patient had presented with a history of chronic osteomyelitis and recent treatment of left issue chronic osteomyelitis with Ancef for about 6-8 weeks who presented to the ED with worsening pain and discharge from the left ischial decubitus ulcer. Patient had some subjective fevers and chills. Patient noted to be afebrile. Patient with normal white count. Patient stated recently finished 2 weeks of IV antibiotics from prior discharge. MRI of the left hip with similar appearance of chronic osteomyelitis involving the left issue with destructive findings involving the left inferior pubic ramus.  Large ulceration below left ischial extending nearly to the bone margin. New focal edema posteriorly in the right issue may reflect early chronic osteomyelitis. Bilateral hip joint effusions with suspected draining sinus tracts laterally along both hips. 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  Patient currently on IV Ancef. Will likely need outpatient wound care. Patient has been seen in consultation by general surgery who has assessed patient's sacral wound in 2 days no evidence of major necrosis, cellulitis, or fasciitis. Air overlay mattress recommended which has been placed.  Patient seen in consultation by ID and cultures noted to be showing Pseudomonas colonization. As patient had not had prior Pseudomonas coverage it was recommended by ID to start patient on cefepime 2 g IV every 8 hours for 4 weeks. Patient will follow back in ID clinic in 2-3 weeks. Continue wound care. Will likely need to go to the wound care center post discharge.   # 2 B 12 deficiency B 12 1000 MCG's IM daily 1 week, and then every weekly 1 month, and then monthly. Patient will need outpatient follow-up.  #3 T9 paraplegia  #4 iron deficiency anemia Hemoglobin currently stable at 9. Continue oral iron supplementation.  #5 bacteriuria. Urine cultures negative. IV Ancef.  #6 constipation Continue MiraLAX daily. Discontinue Senokot. Dulcolax as needed.    DVT prophylaxis: Lovenox. Code Status: Full Family Communication: Updated patient and family at bedside. Disposition Plan: Home when medically stable.   Consultants:   Wound Care nurse.  Gen. surgery: Dr. Michaell Cowing 09/06/2016  Curb sided urology Dr. Mena Goes 09/06/2016  ID: Dr. Drue Second 09/07/2016  Procedures:   MRI left hip 09/04/2016  Antimicrobials:  IV Ancef 09/04/2016>>>>> 09/07/2016  IV cefepime 09/07/2016   Subjective: Patient states pain controlled. No chest and. No shortness of breath. Patient stated had a bowel movement this morning and no longer constipated.   Objective: Vitals:   09/06/16 1403 09/06/16 2113 09/07/16  1424 09/07/16 2105  BP: (!) 112/53 (!) 99/58 95/62 109/67  Pulse: 91 95 69 98  Resp: 16 16 16 16   Temp:   98.3 F (36.8 C) 98.4 F (36.9 C)  TempSrc:   Oral Oral  SpO2: 100% 99% 100% 100%  Weight:        Height:        Intake/Output Summary (Last 24 hours) at 09/08/16 1219 Last data filed at 09/08/16 1117  Gross per 24 hour  Intake             1310 ml  Output                0 ml  Net             1310 ml   Filed Weights   09/03/16 1335  Weight: 65.8 kg (145 lb)    Examination:  General exam: Appears calm and comfortable  Respiratory system: Clear to auscultation. Respiratory effort normal. Cardiovascular system: S1 & S2 heard, RRR. No JVD, murmurs, rubs, gallops or clicks. No pedal edema. Gastrointestinal system: Abdomen is nondistended, soft and nontender. No organomegaly or masses felt. Normal bowel sounds heard. Central nervous system: Alert and oriented. No focal neurological deficits. Extremities: Bilateral lower extremity amputee Skin: Patient with a left ischial area stage IV decubitus. No rashes, lesions or ulcers Psychiatry: Judgement and insight appear normal. Mood & affect appropriate.     Data Reviewed: I have personally reviewed following labs and imaging studies  CBC:  Recent Labs Lab 09/04/16 0012 09/04/16 1556 09/05/16 1034 09/06/16 0452 09/07/16 0516 09/08/16 1050  WBC 10.7* 8.1 5.2 6.4 5.5 6.0  NEUTROABS 6.9 3.9 2.7  --   --   --   HGB 10.2* 9.4* 9.0* 9.1* 9.0* 9.3*  HCT 32.9* 31.4* 30.0* 30.0* 30.0* 31.0*  MCV 77.0* 77.9* 78.1 79.4 78.5 79.1  PLT 459* 411* 327 353 374 355   Basic Metabolic Panel:  Recent Labs Lab 09/04/16 1556 09/05/16 1034 09/06/16 0452 09/07/16 0516 09/08/16 1050  NA 138 137 140 139 136  K 3.5 3.7 3.6 3.7 3.9  CL 106 108 108 107 107  CO2 25 24 27 26 24   GLUCOSE 94 85 91 102* 97  BUN 10 6 7 9 13   CREATININE <0.30* <0.30* <0.30* <0.30* <0.30*  CALCIUM 8.4* 8.2* 8.5* 8.2* 8.5*  MG  --  1.8  --   --   --    GFR: CrCl cannot be calculated (This lab value cannot be used to calculate CrCl because it is not a number: <0.30). Liver Function Tests:  Recent Labs Lab 09/04/16 0012 09/04/16 1556  AST 11* 12*  ALT 8*  6*  ALKPHOS 64 62  BILITOT 0.4 0.3  PROT 8.1 7.7  ALBUMIN 3.2* 3.1*   No results for input(s): LIPASE, AMYLASE in the last 168 hours. No results for input(s): AMMONIA in the last 168 hours. Coagulation Profile: No results for input(s): INR, PROTIME in the last 168 hours. Cardiac Enzymes: No results for input(s): CKTOTAL, CKMB, CKMBINDEX, TROPONINI in the last 168 hours. BNP (last 3 results) No results for input(s): PROBNP in the last 8760 hours. HbA1C: No results for input(s): HGBA1C in the last 72 hours. CBG: No results for input(s): GLUCAP in the last 168 hours. Lipid Profile: No results for input(s): CHOL, HDL, LDLCALC, TRIG, CHOLHDL, LDLDIRECT in the last 72 hours. Thyroid Function Tests: No results for input(s): TSH, T4TOTAL, FREET4, T3FREE, THYROIDAB in the last 72 hours. Anemia  Panel: No results for input(s): VITAMINB12, FOLATE, FERRITIN, TIBC, IRON, RETICCTPCT in the last 72 hours. Sepsis Labs:  Recent Labs Lab 09/04/16 0021  LATICACIDVEN 1.06    Recent Results (from the past 240 hour(s))  Blood culture (routine x 2)     Status: None (Preliminary result)   Collection Time: 09/04/16 12:12 AM  Result Value Ref Range Status   Specimen Description BLOOD BLOOD RIGHT FOREARM  Final   Special Requests IN PEDIATRIC BOTTLE 1CC  Final   Culture   Final    NO GROWTH 4 DAYS [facility]    Report Status PENDING  Incomplete  Blood culture (routine x 2)     Status: None (Preliminary result)   Collection Time: 09/04/16 12:23 AM  Result Value Ref Range Status   Specimen Description BLOOD RIGHT ANTECUBITAL  Final   Special Requests BOTTLES DRAWN AEROBIC AND ANAEROBIC 5CC  Final   Culture   Final    NO GROWTH 4 DAYS Performed at [facility]    Report Status PENDING  Incomplete  Wound or Superficial Culture     Status: None (Preliminary result)   Collection Time: 09/05/16 11:00 AM  Result Value Ref Range Status   Specimen Description WOUND LT  HIP  Final   Special Requests NONE  Final   Gram Stain   Final    FEW WBC PRESENT, PREDOMINANTLY MONONUCLEAR RARE GRAM NEGATIVE RODS    Culture   Final    FEW PSEUDOMONAS AERUGINOSA FEW PROTEUS MIRABILIS SUSCEPTIBILITIES TO FOLLOW Performed at [facility]    Report Status PENDING  Incomplete  Culture, Urine     Status: None   Collection Time: 09/06/16 11:00 AM  Result Value Ref Range Status   Specimen Description URINE, RANDOM  Final   Special Requests NONE  Final   Culture NO GROWTH Performed at [facility]   Final   Report Status 09/07/2016 FINAL  Final         Radiology Studies: No results found.      Scheduled Meds: . ceFEPime (MAXIPIME) IV  2 g Intravenous Q8H  . cyanocobalamin  1,000 mcg Intramuscular Daily  . enoxaparin (LOVENOX) injection  40 mg Subcutaneous Q24H  . iron polysaccharides  150 mg Oral Daily  . polyethylene glycol  17 g Oral BID  . senna-docusate  1 tablet Oral BID  . sorbitol  30 mL Oral Once   Continuous Infusions:   LOS: 4 days    Time spent: 35 minutes.    [facility] Center[facility]OMPS[facility],Thurlow Gallaga, MD [facility] Pager [phone]  If 7PM-7AM, please contact night-coverage [email or web address] Password [email or web address] 09/08/2016, 12:19 PM

## 2016-09-09 LAB — CULTURE, BLOOD (ROUTINE X 2)
Culture: NO GROWTH
Culture: NO GROWTH

## 2016-09-09 MED ORDER — POLYSACCHARIDE IRON COMPLEX 150 MG PO CAPS: 150.0000 mg | ORAL_CAPSULE | Freq: Every day | ORAL | 0 refills | Status: DC

## 2016-09-09 MED ORDER — METHOCARBAMOL 500 MG PO TABS: 500.0000 mg | ORAL_TABLET | Freq: Four times a day (QID) | ORAL | 0 refills | Status: DC | PRN

## 2016-09-09 MED ORDER — SENNOSIDES-DOCUSATE SODIUM 8.6-50 MG PO TABS: 1.0000 | ORAL_TABLET | Freq: Two times a day (BID) | ORAL | Status: DC

## 2016-09-09 MED ORDER — OXYCODONE HCL 10 MG PO TABS: 10.0000 mg | ORAL_TABLET | ORAL | 0 refills | Status: DC | PRN

## 2016-09-09 MED ORDER — DEXTROSE 5 % IV SOLN: 2.0000 g | Freq: Three times a day (TID) | INTRAVENOUS | 0 refills | Status: AC

## 2016-09-09 MED ORDER — HEPARIN SOD (PORK) LOCK FLUSH 100 UNIT/ML IV SOLN
250.0000 [IU] | INTRAVENOUS | Status: AC | PRN
  Administered 2016-09-09: 250 [IU]

## 2016-09-09 MED ORDER — BISACODYL 5 MG PO TBEC: 5.0000 mg | DELAYED_RELEASE_TABLET | Freq: Every day | ORAL | 0 refills | Status: DC | PRN

## 2016-09-09 MED ORDER — ONDANSETRON HCL 4 MG PO TABS: 4.0000 mg | ORAL_TABLET | Freq: Four times a day (QID) | ORAL | 0 refills | Status: DC | PRN

## 2016-09-09 MED ORDER — DIPHENHYDRAMINE HCL 25 MG PO CAPS: 25.0000 mg | ORAL_CAPSULE | Freq: Four times a day (QID) | ORAL | 0 refills | Status: DC | PRN

## 2016-09-09 MED ORDER — POLYETHYLENE GLYCOL 3350 17 G PO PACK: 17.0000 g | PACK | Freq: Every day | ORAL | 0 refills | Status: DC

## 2016-09-09 MED ORDER — CYANOCOBALAMIN 1000 MCG/ML IJ SOLN: 1000.0000 ug | Freq: Every day | INTRAMUSCULAR | 0 refills | Status: DC

## 2016-09-09 MED ORDER — LORAZEPAM 1 MG PO TABS: 1.0000 mg | ORAL_TABLET | Freq: Two times a day (BID) | ORAL | 0 refills | Status: DC | PRN

## 2016-09-09 NOTE — Progress Notes (Signed)
Discharged from floor via pt's own w/c for transport home by car. Spouse & belongings with pt. No changes in assessment. Kathryn Ewing, [employer]

## 2016-09-09 NOTE — Care Management Note (Signed)
Case Management Note  Patient Details  Name: Dorthy CoolerKayla Kinser MRN: [id number] Date of Birth: 07/30/1986  Subjective/Objective:                   Chronic osteomyelitis of hip [facility](HCC)  Action/Plan: CM spoke with the patient at the bedside. [facility] for [facility] health nursing for wound care and IV antibiotics, PT, OT and HHA. Notified the patient's next dose of IV antibiotic is due to today at 1800. Per the patient, she does her own wound care and administers IV antibiotics via a port. Per her nurse she is able to access the port and manage her own dressing changes. [facility] received. [facility] for an air overlay mattress. DME will be delivered to patient's home today. Patient made aware.   Expected Discharge Date:  09/09/16               Expected Discharge Plan:  Home w Home Health Services  In-House Referral:     Discharge planning Services  CM Consult  Post Acute Care Choice:  Home Health Choice offered to:  Patient  DME Arranged:  Specialty mattress DME Agency:  [facility]  HH Arranged:  RN, PT, OT, Nurse's Aide, IV Antibiotics HH Agency:  [facility]  Status of Service:  Completed, signed off  If discussed at Long Length of Stay Meetings, dates discussed:    Additional Comments:  Antony HasteBennett, Flavia Bruss Harris, RN 09/09/2016, 1:11 PM

## 2016-09-09 NOTE — Discharge Summary (Signed)
Physician Discharge Summary  Kathryn Ewing ZOX:[id number] DOB: [birth year] DOA: 09/03/2016  PCP: PROVIDER NOT IN SYSTEM  Admit date: 09/03/2016 Discharge date: 09/09/2016  Time spent: 65 minutes  Recommendations for Outpatient Follow-up:  1. Follow-up with the wound care center as scheduled. 2. Follow up with [facility] for evaluation for placement of suprapubic catheter. 3. Patient states is none pick a PCP to follow-up within the next one to 2 weeks. On follow-up with PCP basic metabolic profile will be needed to follow-up on electrolytes and renal function. Patient is B-12 deficiency will also need to be followed up upon. 4. Follow-up with Dr. [name] or Dr. Drue Second of infectious diseases in the next 2-3 weeks to follow-up for subacute on chronic left ischial osteomyelitis.    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  Diet recommendation: regular  Filed Weights   09/03/16 1335  Weight: 65.8 kg (145 lb)    History of present illness:  Per Dr. Georgena Spurling is a 31 y.o. female with history of T9 paraplegia with history of chronic osteomyelitis,  bilateral BKA secondary to osteomyelitis who has recently moved to [facility], previously used to get her care from [facility], was admitted last month for osteomyelitis of the left ischial area and was treated on Ancef for 6 weeks presents to the ER because of worsening pain and discharge from the left ischial decubitus ulcer. Patient stated last 1 week patient had been having  increasing discharge and pain from the left ischial decubitus ulcer. Patient also stated she had been having subjective feeling of fever and chills. In the ER on exam by the ER physician there was some greenish discharge from the left ischial decubitus ulcer. Patient was mildly tachycardic. Patient was being admitted for further management of possible worsening of patient's known history of chronic osteomyelitis. Will  ED Course: Patient was started on Ancef.  Hospital Course:  #1 decubitus ulcer of the ischial area Stage 4/subacute on chronic ischial osteomyelitis Patient had presented with a history of chronic osteomyelitis and recent treatment of left ischial chronic osteomyelitis with Ancef for about 6-8 weeks who presented to the ED with worsening pain and discharge from the left ischial decubitus ulcer. Patient had some subjective fevers and chills. Patient noted to be afebrile on presentation and throughout the hospitalization. Patient with normal white count. Patient stated recently finished 2 weeks of IV antibiotics from prior discharge. MRI of the left hip with similar appearance of chronic osteomyelitis involving the left issue with destructive findings involving the left inferior pubic ramus.  Large ulceration below left ischial extending nearly to the bone margin. New focal edema posteriorly in the right issue may reflect early chronic osteomyelitis. Bilateral hip joint effusions with suspected draining sinus tracts laterally along both hips. 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 Patient was initially placed on IV Ancef. It was felt patient likely needed outpatient wound care which was arranged by day of discharge. Patient had been seen in consultation by general surgery who assessed patient's sacral wound with no evidence of major necrosis, cellulitis, or fasciitis.  Air overlay mattress recommended which has been placed.  Patient seen in consultation by ID and cultures noted to be showing Pseudomonas  and proteus mirabilis colonization. As patient had not had prior Pseudomonas coverage it was recommended by ID to start patient on cefepime 2 g IV every 8 hours for 4 weeks. Patient will follow back in ID clinic in 2-3 weeks. Patient will be discharged in stable and improved condition and is to follow-up on the wound care center post discharge.  # 2 B 12 deficiency Patient was noted to have a B 12 deficiency with a B-12 level 146. Patient was started on B-12 supplementation of B 12 1000 MCG's IM daily 1 week, and then every weekly 1 month, and then monthly. Patient will need outpatient follow-up.  #3 T9 paraplegia  #4 iron deficiency anemia Hemoglobin remained stable during the hospitalization. Patient was placed on oral iron supplementation. Outpatient follow-up.   #5 bacteriuria. Urine cultures negative.   #6 constipation Patient was placed on MiraLAX daily as well as Senokot and Dulcolax suppository. Patient had a significant bowel movement and resolution of her constipation. Patient will be discharged on a bowel regimen.     Procedures:  MRI left hip 09/04/2016  Consultations:  Wound Care nurse.  Gen. surgery: Dr. Michaell CowingGross 09/06/2016  Curb sided urology Dr. Mena GoesEskridge 09/06/2016  ID: Dr. Drue SecondSnider 09/07/2016  Discharge Exam: Vitals:   09/08/16 1350 09/08/16 2045  BP: (!) 114/57 110/65  Pulse: 98 95  Resp: 15 20  Temp: 98.5 F (36.9 C) 99.1 F (37.3 C)    General: NAD Cardiovascular: RRR Respiratory: CTAB  Discharge Instructions   Discharge Instructions    Diet general    Complete by:  As directed    Increase activity slowly    Complete by:  As directed      Current Discharge Medication List    START taking these medications   Details  bisacodyl (DULCOLAX) 5 MG EC tablet Take 1 tablet (5 mg total) by mouth daily as needed for moderate constipation. Qty: 30 tablet, Refills: 0    ceFEPIme 2 g in dextrose 5 % 50 mL Inject 2 g into the vein every 8  (eight) hours. Take for 4 weeks then stop. Qty: 148 g, Refills: 0    cyanocobalamin (,VITAMIN B-12,) 1000 MCG/ML injection Inject 1 mL (1,000 mcg total) into the muscle daily. Take daily for 3 days, then weekly for 1 month, then monthly. Qty: 10 mL, Refills: 0    diphenhydrAMINE (BENADRYL) 25 mg capsule Take 1 capsule (25 mg total) by mouth every 6 (six) hours as needed for itching or allergies. Qty: 30 capsule, Refills: 0    iron polysaccharides (NIFEREX) 150 MG capsule Take 1 capsule (150 mg total) by mouth daily. Qty: 30 capsule, Refills: 0    LORazepam (ATIVAN) 1 MG tablet Take 1 tablet (1 mg total) by mouth 2 (two) times daily as needed for anxiety. Qty: 10 tablet, Refills: 0    methocarbamol (ROBAXIN) 500 MG tablet Take 1 tablet (500 mg total) by mouth every 6 (six) hours as needed for muscle spasms. Qty: 20 tablet, Refills: 0    ondansetron (ZOFRAN) 4 MG tablet Take 1 tablet (4 mg total) by mouth every 6 (six) hours as needed for nausea. Qty: 20 tablet, Refills: 0    oxyCODONE 10 MG TABS Take 1 tablet (10 mg total) by mouth every 4 (four) hours as needed for moderate pain. Qty: 20 tablet, Refills: 0    polyethylene  glycol (MIRALAX / GLYCOLAX) packet Take 17 g by mouth daily. Qty: 14 each, Refills: 0    senna-docusate (SENOKOT-S) 8.6-50 MG tablet Take 1 tablet by mouth 2 (two) times daily.      STOP taking these medications     ciprofloxacin (CIPRO) 500 MG tablet      traMADol (ULTRAM) 50 MG tablet        Allergies  Allergen Reactions  . Tramadol Nausea And Vomiting, Rash and Other (See Comments)    New allergy Blistering rash, also cause vomiting Vision changes   . Amoxicillin Nausea And Vomiting and Other (See Comments)    Has patient had a PCN reaction causing immediate rash, facial/tongue/throat swelling, SOB or lightheadedness with hypotension: No Has patient had a PCN reaction causing severe rash involving mucus membranes or skin necrosis: No Has patient  had a PCN reaction that required hospitalization No Has patient had a PCN reaction occurring within the last 10 years: Yes If all of the above answers are "NO", then may proceed with Cephalosporin use.  . Codeine Nausea And Vomiting  . Hydrocodone Nausea And Vomiting  . Meropenem Nausea And Vomiting  . Metronidazole Nausea And Vomiting  . Oxycodone-Acetaminophen Itching  . Piperacillin Sod-Tazobactam So Nausea And Vomiting  . Promethazine Nausea And Vomiting  . Diazepam Rash   Follow-up Information    [facility] Follow up.   Why:  call for appointment Contact information: [address] [place] [address] [phone]       [facility], [place], MD Follow up.        [facility]              Follow up on 09/19/2016.   Why:  Appointment at 12:30 on 09/19/16.  Contact information: [address] [place] [address] [phone]       [facility]. Schedule an appointment as soon as possible for a visit in 2 week(s).   Specialty:  Infectious Diseases Why:  f/u with Dr [name] or Dr. Drue Second in 2-3 weeks, Contact information: [address] [place], [address] [id number] mc [place] [address] [phone]       PCP. Schedule an appointment as soon as possible for a visit in 2 day(s).   Why:  F/U with PCP in 2 weeks.           The results of significant diagnostics from this hospitalization (including imaging, microbiology, ancillary and laboratory) are listed below for reference.    Significant Diagnostic Studies: US Transvaginal Non-ob  Result Date: 08/20/2016 CLINICAL DATA:  Right lower quadrant pain x1 month, now increased, evaluate for ovarian torsion EXAM: TRANSABDOMINAL AND TRANSVAGINAL ULTRASOUND OF PELVIS DOPPLER ULTRASOUND OF OVARIES TECHNIQUE: Both transabdominal and transvaginal ultrasound examinations of  the pelvis were performed. Transabdominal technique was performed for global imaging of the pelvis including uterus, ovaries, adnexal regions, and pelvic cul-de-sac. It was necessary to proceed with endovaginal exam following the transabdominal exam to better visualize the bilateral ovaries. Color and duplex Doppler ultrasound was utilized to evaluate blood flow to the ovaries. COMPARISON:  None. FINDINGS: Uterus Measurements: 8.9 x 3.3 x 4.2 cm. No fibroids or other mass visualized. Endometrium Thickness: 4 mm.  No focal abnormality visualized. Right ovary Measurements: 2.1 x 1.9 x 2.9 cm. Small cysts/follicles measuring up to 1.6 cm. Left ovary Measurements: 3.0 x 3.2 x 3.1 cm. Dominant 2.9 x 3.2 x 2.9  cm simple cyst/follicle. Pulsed Doppler evaluation of both ovaries demonstrates normal low-resistance arterial and venous waveforms. Other findings No abnormal free fluid. IMPRESSION: 3.2 cm simple left ovarian cyst/follicle, likely physiologic. No evidence of ovarian torsion. Electronically Signed   By: Charline Bills M.D.   On: 08/20/2016 20:14   US Pelvis Complete  Result Date: 08/20/2016 CLINICAL DATA:  Right lower quadrant pain x1 month, now increased, evaluate for ovarian torsion EXAM: TRANSABDOMINAL AND TRANSVAGINAL ULTRASOUND OF PELVIS DOPPLER ULTRASOUND OF OVARIES TECHNIQUE: Both transabdominal and transvaginal ultrasound examinations of the pelvis were performed. Transabdominal technique was performed for global imaging of the pelvis including uterus, ovaries, adnexal regions, and pelvic cul-de-sac. It was necessary to proceed with endovaginal exam following the transabdominal exam to better visualize the bilateral ovaries. Color and duplex Doppler ultrasound was utilized to evaluate blood flow to the ovaries. COMPARISON:  None. FINDINGS: Uterus Measurements: 8.9 x 3.3 x 4.2 cm. No fibroids or other mass visualized. Endometrium Thickness: 4 mm.  No focal abnormality visualized. Right ovary  Measurements: 2.1 x 1.9 x 2.9 cm. Small cysts/follicles measuring up to 1.6 cm. Left ovary Measurements: 3.0 x 3.2 x 3.1 cm. Dominant 2.9 x 3.2 x 2.9 cm simple cyst/follicle. Pulsed Doppler evaluation of both ovaries demonstrates normal low-resistance arterial and venous waveforms. Other findings No abnormal free fluid. IMPRESSION: 3.2 cm simple left ovarian cyst/follicle, likely physiologic. No evidence of ovarian torsion. Electronically Signed   By: Charline Bills M.D.   On: 08/20/2016 20:14   Ct Abdomen Pelvis W Contrast  Result Date: 08/20/2016 CLINICAL DATA:  Sudden onset right sided lower abdominal pain. Patient is paraplegic with decubitus ulcer on bottom currently on antibiotics the central line. EXAM: CT ABDOMEN AND PELVIS WITH CONTRAST TECHNIQUE: Multidetector CT imaging of the abdomen and pelvis was performed using the standard protocol following bolus administration of intravenous contrast. CONTRAST:  ISOVUE-300 IOPAMIDOL (ISOVUE-300) INJECTION 61% COMPARISON:  Left hip MRI from 08/03/2016 FINDINGS: Lower chest: The visualized cardiac chambers are normal in size. No pericardial nor pleural effusion. Minimal atelectasis and/or scarring at the right lung base. No pneumonic consolidation or pneumothorax. Hepatobiliary: Solitary gallstone without secondary signs of acute cholecystitis. Homogeneous enhancement of the liver without biliary dilatation. Pancreas: Normal Spleen: No splenic mass or splenomegaly. Adrenals/Urinary Tract: Normal adrenal glands. Unremarkable kidneys. Contracted urinary bladder. No obstructive uropathy. Stomach/Bowel: Contracted stomach. Normal bowel rotation. Moderate colonic stool burden. Normal appendix. No acute inflammatory process. Vascular/Lymphatic: IVC filter noted. Numerous small retroperitoneal and mesenteric lymph nodes are present. Bilateral iliac chain and inguinal lymphadenopathy likely reactive secondary to decubitus ulcer of involving the left buttock and  sinus fistulous tract extending to the right hip joint along the right hip. Reproductive: Enhancing right ovarian cyst possibly the corpus luteum or ruptured ovarian cyst. There is also a 2.9 cm left ovarian cyst or follicle. Other: No free air. Musculoskeletal: Thoracolumbar spinal fusion hardware from at least T10 caudad through L2. Sclerosis and bone destruction of the left ischial tuberosity consistent with chronic osteomyelitis secondary to decubitus ulcer. 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 IMPRESSION: 1. Lymphadenopathy likely related to chronic osteomyelitis of the left ischial tuberosity secondary to sacral decubitus ulcer. 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 2. Slightly enhancing 15 mm right ovarian cyst or follicle may represent the corpus luteum or ruptured cyst. Possibly causing the patient's acute onset of right sided abdominal pain. 3.  Cholelithiasis without complication. Electronically Signed   By: Tollie Eth M.D.   On: 08/20/2016 23:18   Mr Hip Left Wo Contrast  Result Date: 09/04/2016 CLINICAL DATA:  Left ischial decubitus ulcer. Assessment for worsening. EXAM: MR OF THE LEFT HIP WITHOUT CONTRAST TECHNIQUE: Multiplanar, multisequence MR imaging was performed. No intravenous contrast was administered. COMPARISON:  08/20/2016 and 08/03/2016 FINDINGS: Bones: Similar appearance of chronic osteomyelitis involving the left ischium and posterior wall left acetabulum and with extensive involvement and destruction of the left inferior pubic ramus. Small focal area of abnormal osseous edema favoring chronic osteomyelitis posteriorly in the right ischium, new compared to the prior exam. Exaggerated lumbosacral carrying angle Articular cartilage and labrum Articular cartilage:  Degenerative chondral thinning in both hips. Labrum:  No definite tear seen.  Joint or bursal effusion Joint effusion: Bilateral hip joint effusions. Suspected gas density in the right hip joint. Bilateral draining sinus tracks from the lateral hips extending to the lateral cutaneous margins. Bursae:  Small amount of right trochanteric bursitis. Muscles and tendons Muscles and tendons: Phlegmon and probably draining sinus tract extending through the right gluteal musculature laterally. Similar finding on the left. Abnormal edema in the left hip adductor musculature, left operator internus, and left proximal hamstring musculature, roughly similar to the prior exam. Other findings Miscellaneous: Reactive adenopathy along the pelvic sidewalls and inguinal regions. Inflammatory edema extends into the left perineum and left labia majora. This is similar to the prior exam. IMPRESSION: 1. Similar appearance of chronic osteomyelitis involving the left ischium and with destructive findings involving the left inferior pubic ramus. Large ulceration below the left ischium extending nearly to the bony margin. 2. New focal edema posteriorly in the right ischium may reflect early chronic osteomyelitis. 3. Bilateral hip joint effusions with suspected draining sinus tracks laterally along both hips. Reactive adenopathy in the pelvis. 4. 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 Electronically Signed   By: Gaylyn Rong M.D.   On: 09/04/2016 15:33   [place] Art/ven Flow Abd Pelv Doppler  Result Date: 08/20/2016 CLINICAL DATA:  Right lower quadrant pain x1 month, now increased, evaluate for ovarian torsion EXAM: TRANSABDOMINAL AND TRANSVAGINAL ULTRASOUND OF PELVIS DOPPLER ULTRASOUND OF OVARIES TECHNIQUE: Both transabdominal and transvaginal ultrasound examinations of the pelvis were performed. Transabdominal technique was performed for global imaging of the pelvis including  uterus, ovaries, adnexal regions, and pelvic cul-de-sac. It was necessary to proceed with endovaginal exam following the transabdominal exam to better visualize the bilateral ovaries. Color and duplex Doppler ultrasound was utilized to evaluate blood flow to the ovaries. COMPARISON:  None. FINDINGS: Uterus Measurements: 8.9 x 3.3 x 4.2 cm. No fibroids or other mass visualized. Endometrium Thickness: 4 mm.  No focal abnormality visualized. Right ovary Measurements: 2.1 x 1.9 x 2.9 cm. Small cysts/follicles measuring up to 1.6 cm. Left ovary Measurements: 3.0 x 3.2 x 3.1 cm. Dominant 2.9 x 3.2 x 2.9 cm simple cyst/follicle. Pulsed Doppler evaluation of both ovaries demonstrates normal low-resistance arterial and venous waveforms. Other findings No abnormal free fluid. IMPRESSION: 3.2 cm simple left ovarian cyst/follicle, likely physiologic. No evidence of ovarian torsion. Electronically Signed   By: Charline Bills M.D.   On: 08/20/2016 20:14   Dg Chest Port 1 View  Result Date: 09/04/2016 CLINICAL DATA:  Cough EXAM: PORTABLE CHEST 1 VIEW COMPARISON:  08/02/2016 FINDINGS: Cardiomediastinal silhouette is stable. Right IJ central line  with tip in SVC right atrium junction is unchanged in position. Again noted metallic fixation rods lower thoracic and upper lumbar spine. No infiltrate or pleural effusion. No pulmonary edema. IMPRESSION: No active disease. Electronically Signed   By: Natasha Mead M.D.   On: 09/04/2016 10:59    Microbiology: Recent Results (from the past 240 hour(s))  Blood culture (routine x 2)     Status: None   Collection Time: 09/04/16 12:12 AM  Result Value Ref Range Status   Specimen Description BLOOD BLOOD RIGHT FOREARM  Final   Special Requests IN PEDIATRIC BOTTLE 1CC  Final   Culture   Final    NO GROWTH 5 DAYS Performed at [facility]    Report Status 09/09/2016 FINAL  Final  Blood culture (routine x 2)     Status: None   Collection Time: 09/04/16 12:23 AM  Result  Value Ref Range Status   Specimen Description BLOOD RIGHT ANTECUBITAL  Final   Special Requests BOTTLES DRAWN AEROBIC AND ANAEROBIC 5CC  Final   Culture   Final    NO GROWTH 5 DAYS Performed at [facility]    Report Status 09/09/2016 FINAL  Final  Wound or Superficial Culture     Status: None (Preliminary result)   Collection Time: 09/05/16 11:00 AM  Result Value Ref Range Status   Specimen Description WOUND LT HIP  Final   Special Requests NONE  Final   Gram Stain   Final    FEW WBC PRESENT, PREDOMINANTLY MONONUCLEAR RARE GRAM NEGATIVE RODS    Culture   Final    FEW PSEUDOMONAS AERUGINOSA FEW PROTEUS MIRABILIS FEW STAPHYLOCOCCUS AUREUS SUSCEPTIBILITIES TO FOLLOW Performed at [facility]    Report Status PENDING  Incomplete   Organism ID, Bacteria PSEUDOMONAS AERUGINOSA  Final   Organism ID, Bacteria PROTEUS MIRABILIS  Final      Susceptibility   Pseudomonas aeruginosa - MIC*    CEFTAZIDIME 2 SENSITIVE Sensitive     CIPROFLOXACIN <=0.25 SENSITIVE Sensitive     GENTAMICIN 4 SENSITIVE Sensitive     IMIPENEM 2 SENSITIVE Sensitive     PIP/TAZO <=4 SENSITIVE Sensitive     CEFEPIME 2 SENSITIVE Sensitive     * FEW PSEUDOMONAS AERUGINOSA   Proteus mirabilis - MIC*    AMPICILLIN >=32 RESISTANT Resistant     CEFAZOLIN >=64 RESISTANT Resistant     CEFEPIME <=1 SENSITIVE Sensitive     CEFTAZIDIME <=1 SENSITIVE Sensitive     CEFTRIAXONE <=1 SENSITIVE Sensitive     CIPROFLOXACIN <=0.25 SENSITIVE Sensitive     GENTAMICIN <=1 SENSITIVE Sensitive     IMIPENEM 1 SENSITIVE Sensitive     TRIMETH/SULFA <=20 SENSITIVE Sensitive     AMPICILLIN/SULBACTAM 8 SENSITIVE Sensitive     PIP/TAZO <=4 SENSITIVE Sensitive     * FEW PROTEUS MIRABILIS  Culture, Urine     Status: None   Collection Time: 09/06/16 11:00 AM  Result Value Ref Range Status   Specimen Description URINE, RANDOM  Final   Special Requests NONE  Final   Culture NO GROWTH Performed at [facility]    Final   Report Status 09/07/2016 FINAL  Final     Labs: Basic Metabolic Panel:  Recent Labs Lab 09/04/16 1556 09/05/16 1034 09/06/16 0452 09/07/16 0516 09/08/16 1050  NA 138 137 140 139 136  K 3.5 3.7 3.6 3.7 3.9  CL 106 108 108 107 107  CO2 25 24 27 26 24   GLUCOSE 94 85  91 102* 97  BUN 10 6 7 9 13   CREATININE <0.30* <0.30* <0.30* <0.30* <0.30*  CALCIUM 8.4* 8.2* 8.5* 8.2* 8.5*  MG  --  1.8  --   --   --    Liver Function Tests:  Recent Labs Lab 09/04/16 0012 09/04/16 1556  AST 11* 12*  ALT 8* 6*  ALKPHOS 64 62  BILITOT 0.4 0.3  PROT 8.1 7.7  ALBUMIN 3.2* 3.1*   No results for input(s): LIPASE, AMYLASE in the last 168 hours. No results for input(s): AMMONIA in the last 168 hours. CBC:  Recent Labs Lab 09/04/16 0012 09/04/16 1556 09/05/16 1034 09/06/16 0452 09/07/16 0516 09/08/16 1050  WBC 10.7* 8.1 5.2 6.4 5.5 6.0  NEUTROABS 6.9 3.9 2.7  --   --   --   HGB 10.2* 9.4* 9.0* 9.1* 9.0* 9.3*  HCT 32.9* 31.4* 30.0* 30.0* 30.0* 31.0*  MCV 77.0* 77.9* 78.1 79.4 78.5 79.1  PLT 459* 411* 327 353 374 355   Cardiac Enzymes: No results for input(s): CKTOTAL, CKMB, CKMBINDEX, TROPONINI in the last 168 hours. BNP: BNP (last 3 results) No results for input(s): BNP in the last 8760 hours.  ProBNP (last 3 results) No results for input(s): PROBNP in the last 8760 hours.  CBG: No results for input(s): GLUCAP in the last 168 hours.     SignedRamiro Harvest MD.  [facility] 09/09/2016, 12:14 PM

## 2016-09-10 LAB — AEROBIC CULTURE  (SUPERFICIAL SPECIMEN)

## 2016-09-10 LAB — AEROBIC CULTURE W GRAM STAIN (SUPERFICIAL SPECIMEN)

## 2016-09-14 DIAGNOSIS — A498 Other bacterial infections of unspecified site: Secondary | ICD-10-CM

## 2016-09-19 ENCOUNTER — Encounter (HOSPITAL_BASED_OUTPATIENT_CLINIC_OR_DEPARTMENT_OTHER): Payer: Medicare Other | Attending: Surgery

## 2016-09-19 DIAGNOSIS — Z79899 Other long term (current) drug therapy: Secondary | ICD-10-CM | POA: Insufficient documentation

## 2016-09-19 DIAGNOSIS — M8639 Chronic multifocal osteomyelitis, multiple sites: Secondary | ICD-10-CM | POA: Diagnosis not present

## 2016-09-19 DIAGNOSIS — G8221 Paraplegia, complete: Secondary | ICD-10-CM | POA: Insufficient documentation

## 2016-09-19 DIAGNOSIS — Z89511 Acquired absence of right leg below knee: Secondary | ICD-10-CM | POA: Diagnosis not present

## 2016-09-19 DIAGNOSIS — L89324 Pressure ulcer of left buttock, stage 4: Secondary | ICD-10-CM | POA: Insufficient documentation

## 2016-09-19 DIAGNOSIS — Z89512 Acquired absence of left leg below knee: Secondary | ICD-10-CM | POA: Insufficient documentation

## 2016-09-25 DIAGNOSIS — L89324 Pressure ulcer of left buttock, stage 4: Secondary | ICD-10-CM | POA: Diagnosis not present

## 2016-10-01 ENCOUNTER — Telehealth: Payer: Self-pay

## 2016-10-01 NOTE — Telephone Encounter (Signed)
[facility] Nurse calling for cath flow order. Patient has midline that will not draw back or flush .  Patient has office visit on 10-03-15.  Verbal order for cath flow given per protocol.  Laurell Josephsammy K Chastity Noland, RN   [phone] phone

## 2016-10-02 ENCOUNTER — Inpatient Hospital Stay: Payer: Medicare Other | Admitting: Internal Medicine

## 2016-10-02 ENCOUNTER — Encounter (HOSPITAL_BASED_OUTPATIENT_CLINIC_OR_DEPARTMENT_OTHER): Payer: Medicare Other | Attending: Surgery

## 2016-10-16 ENCOUNTER — Emergency Department (HOSPITAL_COMMUNITY)
Admission: EM | Admit: 2016-10-16 | Discharge: 2016-10-17 | Disposition: A | Discharge status: Home / Self Care | Payer: Medicare Other | Attending: Emergency Medicine | Admitting: Emergency Medicine

## 2016-10-16 ENCOUNTER — Encounter (HOSPITAL_COMMUNITY): Payer: Self-pay

## 2016-10-16 DIAGNOSIS — Z87891 Personal history of nicotine dependence: Secondary | ICD-10-CM | POA: Diagnosis not present

## 2016-10-16 DIAGNOSIS — L98419 Non-pressure chronic ulcer of buttock with unspecified severity: Secondary | ICD-10-CM | POA: Diagnosis not present

## 2016-10-16 DIAGNOSIS — Z79899 Other long term (current) drug therapy: Secondary | ICD-10-CM | POA: Diagnosis not present

## 2016-10-16 DIAGNOSIS — L8990 Pressure ulcer of unspecified site, unspecified stage: Secondary | ICD-10-CM

## 2016-10-16 DIAGNOSIS — R1084 Generalized abdominal pain: Secondary | ICD-10-CM | POA: Diagnosis not present

## 2016-10-16 DIAGNOSIS — R109 Unspecified abdominal pain: Secondary | ICD-10-CM

## 2016-10-16 LAB — CBC WITH DIFFERENTIAL/PLATELET
Basophils Absolute: 0 10*3/uL (ref 0.0–0.1)
Basophils Relative: 0 %
EOS ABS: 0.2 10*3/uL (ref 0.0–0.7)
EOS PCT: 2 %
HCT: 32.6 % — ABNORMAL LOW (ref 36.0–46.0)
HEMOGLOBIN: 10.1 g/dL — AB (ref 12.0–15.0)
Lymphocytes Relative: 31 %
Lymphs Abs: 3.4 10*3/uL (ref 0.7–4.0)
MCH: 24 pg — AB (ref 26.0–34.0)
MCHC: 31 g/dL (ref 30.0–36.0)
MCV: 77.4 fL — ABNORMAL LOW (ref 78.0–100.0)
MONOS PCT: 7 %
Monocytes Absolute: 0.8 10*3/uL (ref 0.1–1.0)
NEUTROS PCT: 60 %
Neutro Abs: 6.7 10*3/uL (ref 1.7–7.7)
PLATELETS: 356 10*3/uL (ref 150–400)
RBC: 4.21 MIL/uL (ref 3.87–5.11)
RDW: 17 % — AB (ref 11.5–15.5)
WBC: 11.2 10*3/uL — ABNORMAL HIGH (ref 4.0–10.5)

## 2016-10-16 LAB — COMPREHENSIVE METABOLIC PANEL
ALBUMIN: 3.5 g/dL (ref 3.5–5.0)
ALT: 10 U/L — ABNORMAL LOW (ref 14–54)
ANION GAP: 8 (ref 5–15)
AST: 10 U/L — AB (ref 15–41)
Alkaline Phosphatase: 75 U/L (ref 38–126)
BUN: 7 mg/dL (ref 6–20)
CO2: 22 mmol/L (ref 22–32)
Calcium: 8.7 mg/dL — ABNORMAL LOW (ref 8.9–10.3)
Chloride: 107 mmol/L (ref 101–111)
Creatinine, Ser: <0.3 mg/dL — ABNORMAL LOW (ref 0.44–1.00)
GLUCOSE: 90 mg/dL (ref 65–99)
POTASSIUM: 3.2 mmol/L — AB (ref 3.5–5.1)
SODIUM: 137 mmol/L (ref 135–145)
Total Bilirubin: 0.5 mg/dL (ref 0.3–1.2)
Total Protein: 7.8 g/dL (ref 6.5–8.1)

## 2016-10-16 LAB — LIPASE, BLOOD: Lipase: 17 U/L (ref 11–51)

## 2016-10-16 LAB — HCG, QUANTITATIVE, PREGNANCY: hCG, Beta Chain, Quant, S: <1 m[IU]/mL (ref <5)

## 2016-10-16 MED ORDER — KETOROLAC TROMETHAMINE 30 MG/ML IJ SOLN
15.0000 mg | Freq: Once | INTRAMUSCULAR | Status: AC
  Administered 2016-10-16: 15 mg via INTRAVENOUS
  Filled 2016-10-16: qty 1

## 2016-10-16 MED ORDER — HYDROCODONE-ACETAMINOPHEN 5-325 MG PO TABS: 1.0000 | ORAL_TABLET | ORAL | 0 refills | Status: DC | PRN

## 2016-10-16 MED ORDER — ONDANSETRON HCL 4 MG/2ML IJ SOLN
4.0000 mg | Freq: Once | INTRAMUSCULAR | Status: AC
  Administered 2016-10-16: 4 mg via INTRAVENOUS
  Filled 2016-10-16: qty 2

## 2016-10-16 MED ORDER — HEPARIN SOD (PORK) LOCK FLUSH 100 UNIT/ML IV SOLN
500.0000 [IU] | Freq: Once | INTRAVENOUS | Status: AC
  Administered 2016-10-16: 500 [IU]
  Filled 2016-10-16: qty 5

## 2016-10-16 MED ORDER — ONDANSETRON 4 MG PO TBDP: 4.0000 mg | ORAL_TABLET | Freq: Three times a day (TID) | ORAL | 0 refills | Status: DC | PRN

## 2016-10-16 MED ORDER — POTASSIUM CHLORIDE CRYS ER 20 MEQ PO TBCR
40.0000 meq | EXTENDED_RELEASE_TABLET | Freq: Once | ORAL | Status: DC
  Filled 2016-10-16: qty 2

## 2016-10-16 MED ORDER — HYDROCODONE-ACETAMINOPHEN 5-325 MG PO TABS
1.0000 | ORAL_TABLET | Freq: Once | ORAL | Status: AC
  Administered 2016-10-16: 1 via ORAL
  Filled 2016-10-16: qty 1

## 2016-10-16 MED ORDER — FENTANYL CITRATE (PF) 100 MCG/2ML IJ SOLN
50.0000 ug | Freq: Once | INTRAMUSCULAR | Status: AC
  Administered 2016-10-16: 50 ug via INTRAVENOUS
  Filled 2016-10-16: qty 2

## 2016-10-16 MED ORDER — SODIUM CHLORIDE 0.9 % IV BOLUS (SEPSIS)
1000.0000 mL | Freq: Once | INTRAVENOUS | Status: AC
  Administered 2016-10-16: 1000 mL via INTRAVENOUS

## 2016-10-16 NOTE — ED Notes (Signed)
Bed: WA06 Expected date:  Expected time:  Means of arrival:  Comments: EMS 

## 2016-10-16 NOTE — ED Triage Notes (Signed)
Patient from home with pressure ulcer to the left buttock for about a year. She been taking antibiotic for 6 weeks. Home health came to check her central line and dressing change.  once's antibiotic was completed the home health no longer came to see her. So she is doing her own dressing changes and she notices her dressing  change from the buttock was bloody. Pt c/o pain to her right side rating pain 8/10.

## 2016-10-16 NOTE — ED Notes (Signed)
Pt requested urinary catheter to self cath, provided.

## 2016-10-16 NOTE — Discharge Instructions (Signed)
Continue taking your home medications as prescribed.  I recommend following up with the women's clinic listed below to schedule a follow-up appointment for further evaluation and management of your ovarian cysts. I also recommend following up with the wound clinic for further management of your pressure ulcer. Follow up with the primary care provider's office in the next week to have your potassium rechecked which was noted to be 3.2 in the ED today. Return to the emergency department if symptoms worsen or new onset of fever, purulent drainage, swelling, redness, vomiting, unable to keep fluids down, nasal/worsening abdominal pain.

## 2016-10-16 NOTE — ED Notes (Signed)
Pt. Awaiting for [facility] for transport.

## 2016-10-16 NOTE — ED Provider Notes (Signed)
[facility]-EMERGENCY DEPT Provider Note   CSN: [id number] Arrival date & time: 10/16/16  1658     History   Chief Complaint Chief Complaint  Patient presents with  . Pressure Ulcer    left buttock pressure ulcer for 1 year    HPI Kathryn Ewing is a 31 y.o. female.  HPI   Patient is a 31 year old female with history of paraplegia, bilateral BKA and osteomyelitis who presents to the ED with complaint of pressure ulcer. Patient reports over the past year she has had waxing and waning pressure ulcer to her left buttocks with history of ostemyelitis. She reports being on antibiotics on and off for the past year and notes she was most recently on cefepime for 6 weeks and states she finished the antibiotics on 10/06/16. Patient reports earlier today she noticed a large amount of blood that was soaked on her dressing making her concerned and resulting her coming to the ED for evaluation. Denies fever, redness, swelling, purulent drainage. Patient notes she is followed by the wound clinic regarding her pressure ulcers but denies being evaluated by them this week and notes she needs to schedule a follow-up appointment for next week.  Patient also reports having intermittent abdominal pain for the past week. She reports having sharp cramping sensation across her abdomen with associated intermittent nausea. Patient reports her abdominal pain feels consistent to pain she has had in the past related to her ovarian cyst. Denies fever, chest pain, shortness of breath, vomiting, diarrhea, urinary symptoms, vaginal bleeding or discharge. Denies taking any medications at home for her symptoms.  Past Medical History:  Diagnosis Date  . Amputee   . Amputee, below knee, left (HCC)   . Amputee, below knee, right (HCC)   . Anemia, iron deficiency 09/05/2016  . B12 deficiency 09/05/2016  . Osteomyelitis (HCC)   . S/P flap graft     Patient Active Problem List   Diagnosis Date Noted  . Pseudomonas infection   .  Chronic osteomyelitis (HCC)   . Urinary tract infection without hematuria   . B12 deficiency 09/05/2016  . Anemia, iron deficiency 09/05/2016  . Decubitus ulcer of ischial area, left, stage IV (HCC) 09/04/2016  . Chronic osteomyelitis of hip (HCC): Subacute on chronic left ischial osteomyelitis 08/02/2016  . Tachycardia 08/02/2016  . Lower urinary tract infectious disease 08/02/2016  . Pressure injury of skin 08/02/2016  . S/P bilateral BKA (below knee amputation) (HCC)   . Amputee, below knee, right (HCC)   . Neurogenic bladder 05/10/2016  . History of osteomyelitis 04/17/2016  . 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 Depression 11/02/2015  . Anxiety 11/02/2015  . Chronic pain due to trauma 09/29/2014  . Decreased mobility 07/20/2014  . MSSA (methicillin susceptible Staphylococcus aureus) infection 07/16/2014  . Anemia of infection and chronic disease 10/22/2012    Past Surgical History:  Procedure Laterality Date  . BACK SURGERY    . CESAREAN SECTION    . FREE FLAP GRAFT      OB History    No data available       Home Medications    Prior to Admission medications   Medication Sig Start Date End Date Taking? Authorizing Provider  bisacodyl (DULCOLAX) 5 MG EC tablet Take 1 tablet (5 mg total) by mouth daily as needed for moderate constipation. Patient not taking: Reported on 10/16/2016 09/09/16   Rodolph Bonganiel Thompson V, MD  cyanocobalamin (,VITAMIN B-12,) 1000 MCG/ML injection Inject 1 mL (1,000 mcg total) into the  muscle daily. Take daily for 3 days, then weekly for 1 month, then monthly. Patient not taking: Reported on 10/16/2016 09/10/16   Rodolph Bong, MD  diphenhydrAMINE (BENADRYL) 25 mg capsule Take 1 capsule (25 mg total) by mouth every 6 (six) hours as needed for itching or allergies. Patient not taking: Reported on 10/16/2016 09/09/16   Rodolph Bong, MD  HYDROcodone-acetaminophen (NORCO/VICODIN) 5-325 MG tablet Take 1 tablet by mouth every 4 (four) hours as  needed. 10/16/16   Barrett Henle, PA-C  iron polysaccharides (NIFEREX) 150 MG capsule Take 1 capsule (150 mg total) by mouth daily. Patient not taking: Reported on 10/16/2016 09/10/16   Rodolph Bong, MD  LORazepam (ATIVAN) 1 MG tablet Take 1 tablet (1 mg total) by mouth 2 (two) times daily as needed for anxiety. Patient not taking: Reported on 10/16/2016 09/09/16   Rodolph Bong, MD  methocarbamol (ROBAXIN) 500 MG tablet Take 1 tablet (500 mg total) by mouth every 6 (six) hours as needed for muscle spasms. Patient not taking: Reported on 10/16/2016 09/09/16   Rodolph Bong, MD  ondansetron (ZOFRAN ODT) 4 MG disintegrating tablet Take 1 tablet (4 mg total) by mouth every 8 (eight) hours as needed for nausea or vomiting. 10/16/16   Barrett Henle, PA-C  ondansetron (ZOFRAN) 4 MG tablet Take 1 tablet (4 mg total) by mouth every 6 (six) hours as needed for nausea. Patient not taking: Reported on 10/16/2016 09/09/16   Rodolph Bong, MD  oxyCODONE 10 MG TABS Take 1 tablet (10 mg total) by mouth every 4 (four) hours as needed for moderate pain. Patient not taking: Reported on 10/16/2016 09/09/16   Rodolph Bong, MD  polyethylene glycol [facility]) packet Take 17 g by mouth daily. Patient not taking: Reported on 10/16/2016 09/09/16   Rodolph Bong, MD  senna-docusate (SENOKOT-S) 8.6-50 MG tablet Take 1 tablet by mouth 2 (two) times daily. Patient not taking: Reported on 10/16/2016 09/09/16   Rodolph Bong, MD    Family History Family History  Problem Relation Age of Onset  . Diabetes Mellitus II Other     Social History Social History  Substance Use Topics  . Smoking status: Former Games developer  . Smokeless tobacco: Never Used  . Alcohol use No     Allergies   Tramadol; Amoxicillin; Codeine; Hydrocodone; Meropenem; Metronidazole; Oxycodone-acetaminophen; Piperacillin sod-tazobactam so; Promethazine; and Diazepam   Review of Systems Review of Systems    Gastrointestinal: Positive for abdominal pain and nausea.  Skin: Positive for wound.  All other systems reviewed and are negative.        Physical Exam Updated Vital Signs BP (!) 110/52 (BP Location: Right Arm)   Pulse 91   Temp 98.6 F (37 C) (Oral)   Resp 18   Ht 5\' 4"  (1.626 m) Comment: pre amputation  Wt 68 kg   LMP 10/12/2016   SpO2 99%   BMI 25.75 kg/m   Physical Exam  Constitutional: She is oriented to person, place, and time. She appears well-developed and well-nourished. No distress.  HENT:  Head: Normocephalic and atraumatic.  Mouth/Throat: Uvula is midline, oropharynx is clear and moist and mucous membranes are normal. No oropharyngeal exudate, posterior oropharyngeal edema, posterior oropharyngeal erythema or tonsillar abscesses. No tonsillar exudate.  Eyes: Conjunctivae and EOM are normal. Right eye exhibits no discharge. Left eye exhibits no discharge. No scleral icterus.  Neck: Normal range of motion. Neck supple.  Cardiovascular: Normal rate, regular rhythm, normal  heart sounds and intact distal pulses.   Pulmonary/Chest: Effort normal and breath sounds normal. No respiratory distress. She has no wheezes. She has no rales. She exhibits no tenderness.  Abdominal: Soft. Bowel sounds are normal. She exhibits no distension and no mass. There is tenderness. There is no rigidity, no rebound, no guarding and negative Murphy's sign. No hernia.  Mild diffuse abdominal tenderness  Musculoskeletal: She exhibits no edema.  Neurological: She is alert and oriented to person, place, and time.  Skin: Skin is warm and dry. She is not diaphoretic.  Large open pressure wound noted to left buttocks. No drainage or bleeding present. No surrounding swelling, erythema, induration or fluctuance present.   Nursing note and vitals reviewed.    ED Treatments / Results  Labs (all labs ordered are listed, but only abnormal results are displayed) Labs Reviewed  CBC WITH  DIFFERENTIAL/PLATELET - Abnormal; Notable for the following:       Result Value   WBC 11.2 (*)    Hemoglobin 10.1 (*)    HCT 32.6 (*)    MCV 77.4 (*)    MCH 24.0 (*)    RDW 17.0 (*)    All other components within normal limits  COMPREHENSIVE METABOLIC PANEL - Abnormal; Notable for the following:    Potassium 3.2 (*)    Creatinine, Ser <0.30 (*)    Calcium 8.7 (*)    AST 10 (*)    ALT 10 (*)    All other components within normal limits  LIPASE, BLOOD  HCG, QUANTITATIVE, PREGNANCY    EKG  EKG Interpretation None       Radiology No results found.  Procedures Procedures (including critical care time)  Medications Ordered in ED Medications  potassium chloride SA (K-DUR,KLOR-CON) CR tablet 40 mEq (40 mEq Oral Refused 10/16/16 2035)  sodium chloride 0.9 % bolus 1,000 mL (0 mLs Intravenous Stopped 10/16/16 2051)  ondansetron (ZOFRAN) injection 4 mg (4 mg Intravenous Given 10/16/16 1906)  fentaNYL (SUBLIMAZE) injection 50 mcg (50 mcg Intravenous Given 10/16/16 1907)  ketorolac (TORADOL) 30 MG/ML injection 15 mg (15 mg Intravenous Given 10/16/16 2049)  HYDROcodone-acetaminophen (NORCO/VICODIN) 5-325 MG per tablet 1 tablet (1 tablet Oral Given 10/16/16 2146)     Initial Impression / Assessment and Plan / ED Course  I have reviewed the triage vital signs and the nursing notes.  Pertinent labs & imaging results that were available during my care of the patient were reviewed by me and considered in my medical decision making (see chart for details).     Patient presents with reported bleeding from her pressure ulcer that she noticed earlier today. She notes she has history of recurrent worsening pressure ulcers with osteomyelitis. Patient finished 6 week regimen of cefepime which ended on 10/06/16. Denies any fever, redness, swelling or purulent drainage. VSS. Exam revealed large open pressure wound to left buttock without swelling, erythema, warmth or purulent drainage, no active  bleeding. WBC 11.2. Afebrile. Wound dose note appear to be infected, do not feel that antibiotics need to be restarted at this time. Advised patient to follow up with the wound clinic within the next week for follow-up evaluation and further management of her pressure ulcer.  Patient also presents with abdominal pain with associated nausea which have been present for the past week. Reports pain feels similar to ovarian cyst she has had in the past. Denies fever, vomiting, vaginal bleeding or discharge, diarrhea. Exam revealed mild diffuse abdominal tenderness, no peritoneal signs. Patient given pain  medication and antiemetics. Pregnancy negative. Labs revealed potassium 3.2, plan to give patient oral potassium supplement in the ED. Notified by nurse that patient refuses potassium supplement due to it causing her extreme nausea and vomiting. Patient reports she wants to have her lab work followed up outpatient. Advised patient to have her potassium rechecked within the next week. On reevaluation patient reports her symptoms have improved. Suspect patient's symptoms are likely related to her history of ovarian cysts. No indication of appendicitis, bowel obstruction, bowel perforation, cholecystitis, diverticulitis, PID or ectopic pregnancy.  Patient discharged home with symptomatic treatment and given strict instructions for follow-up with women's clinic for further management.  I have also discussed reasons to return immediately to the ER.  Patient expresses understanding and agrees with plan.     Final Clinical Impressions(s) / ED Diagnoses   Final diagnoses:  Abdominal pain, unspecified abdominal location  Pressure ulcer, unspecified location, unspecified ulcer stage    New Prescriptions New Prescriptions   HYDROCODONE-ACETAMINOPHEN (NORCO/VICODIN) 5-325 MG TABLET    Take 1 tablet by mouth every 4 (four) hours as needed.   ONDANSETRON (ZOFRAN ODT) 4 MG DISINTEGRATING TABLET    Take 1 tablet (4 mg  total) by mouth every 8 (eight) hours as needed for nausea or vomiting.     Satira Sark [place], [place] 10/16/16 2218    Lyndal Pulley, MD 10/18/16 [phone]

## 2016-10-16 NOTE — ED Triage Notes (Addendum)
Pt is amputee right and left below the knee.

## 2016-11-06 ENCOUNTER — Emergency Department (HOSPITAL_COMMUNITY): Payer: Medicare Other

## 2016-11-06 ENCOUNTER — Inpatient Hospital Stay (HOSPITAL_COMMUNITY): Payer: Medicare Other

## 2016-11-06 ENCOUNTER — Inpatient Hospital Stay (HOSPITAL_COMMUNITY)
Admission: EM | Admit: 2016-11-06 | Discharge: 2016-11-09 | DRG: 871 | Disposition: A | Discharge status: Home / Self Care | Payer: Medicare Other | Attending: Family Medicine | Admitting: Family Medicine

## 2016-11-06 ENCOUNTER — Encounter (HOSPITAL_COMMUNITY): Payer: Self-pay

## 2016-11-06 DIAGNOSIS — L89324 Pressure ulcer of left buttock, stage 4: Secondary | ICD-10-CM | POA: Diagnosis not present

## 2016-11-06 DIAGNOSIS — F419 Anxiety disorder, unspecified: Secondary | ICD-10-CM | POA: Diagnosis present

## 2016-11-06 DIAGNOSIS — N390 Urinary tract infection, site not specified: Secondary | ICD-10-CM | POA: Diagnosis not present

## 2016-11-06 DIAGNOSIS — L89224 Pressure ulcer of left hip, stage 4: Secondary | ICD-10-CM | POA: Diagnosis present

## 2016-11-06 DIAGNOSIS — Z87891 Personal history of nicotine dependence: Secondary | ICD-10-CM | POA: Diagnosis not present

## 2016-11-06 DIAGNOSIS — Z833 Family history of diabetes mellitus: Secondary | ICD-10-CM

## 2016-11-06 DIAGNOSIS — Z9889 Other specified postprocedural states: Secondary | ICD-10-CM

## 2016-11-06 DIAGNOSIS — F329 Major depressive disorder, single episode, unspecified: Secondary | ICD-10-CM | POA: Diagnosis present

## 2016-11-06 DIAGNOSIS — F32A Depression, unspecified: Secondary | ICD-10-CM | POA: Diagnosis present

## 2016-11-06 DIAGNOSIS — Z880 Allergy status to penicillin: Secondary | ICD-10-CM | POA: Diagnosis not present

## 2016-11-06 DIAGNOSIS — R7881 Bacteremia: Secondary | ICD-10-CM | POA: Diagnosis not present

## 2016-11-06 DIAGNOSIS — Z89512 Acquired absence of left leg below knee: Secondary | ICD-10-CM

## 2016-11-06 DIAGNOSIS — Z7401 Bed confinement status: Secondary | ICD-10-CM

## 2016-11-06 DIAGNOSIS — Z881 Allergy status to other antibiotic agents status: Secondary | ICD-10-CM | POA: Diagnosis not present

## 2016-11-06 DIAGNOSIS — A419 Sepsis, unspecified organism: Secondary | ICD-10-CM | POA: Diagnosis not present

## 2016-11-06 DIAGNOSIS — G839 Paralytic syndrome, unspecified: Secondary | ICD-10-CM | POA: Diagnosis not present

## 2016-11-06 DIAGNOSIS — N3000 Acute cystitis without hematuria: Secondary | ICD-10-CM

## 2016-11-06 DIAGNOSIS — E538 Deficiency of other specified B group vitamins: Secondary | ICD-10-CM | POA: Diagnosis present

## 2016-11-06 DIAGNOSIS — R634 Abnormal weight loss: Secondary | ICD-10-CM | POA: Diagnosis not present

## 2016-11-06 DIAGNOSIS — M8660 Other chronic osteomyelitis, unspecified site: Secondary | ICD-10-CM | POA: Diagnosis present

## 2016-11-06 DIAGNOSIS — M4628 Osteomyelitis of vertebra, sacral and sacrococcygeal region: Secondary | ICD-10-CM | POA: Diagnosis not present

## 2016-11-06 DIAGNOSIS — Z95828 Presence of other vascular implants and grafts: Secondary | ICD-10-CM | POA: Diagnosis not present

## 2016-11-06 DIAGNOSIS — L89154 Pressure ulcer of sacral region, stage 4: Secondary | ICD-10-CM | POA: Diagnosis not present

## 2016-11-06 DIAGNOSIS — Z888 Allergy status to other drugs, medicaments and biological substances status: Secondary | ICD-10-CM

## 2016-11-06 DIAGNOSIS — G8921 Chronic pain due to trauma: Secondary | ICD-10-CM | POA: Diagnosis present

## 2016-11-06 DIAGNOSIS — G8220 Paraplegia, unspecified: Secondary | ICD-10-CM | POA: Diagnosis present

## 2016-11-06 DIAGNOSIS — Z886 Allergy status to analgesic agent status: Secondary | ICD-10-CM | POA: Diagnosis not present

## 2016-11-06 DIAGNOSIS — Z89511 Acquired absence of right leg below knee: Secondary | ICD-10-CM

## 2016-11-06 DIAGNOSIS — N319 Neuromuscular dysfunction of bladder, unspecified: Secondary | ICD-10-CM | POA: Diagnosis not present

## 2016-11-06 DIAGNOSIS — Z885 Allergy status to narcotic agent status: Secondary | ICD-10-CM | POA: Diagnosis not present

## 2016-11-06 DIAGNOSIS — D509 Iron deficiency anemia, unspecified: Secondary | ICD-10-CM | POA: Diagnosis present

## 2016-11-06 DIAGNOSIS — B9620 Unspecified Escherichia coli [E. coli] as the cause of diseases classified elsewhere: Secondary | ICD-10-CM | POA: Diagnosis present

## 2016-11-06 DIAGNOSIS — R509 Fever, unspecified: Secondary | ICD-10-CM | POA: Diagnosis not present

## 2016-11-06 DIAGNOSIS — R8271 Bacteriuria: Secondary | ICD-10-CM | POA: Diagnosis not present

## 2016-11-06 DIAGNOSIS — R000 Tachycardia, unspecified: Secondary | ICD-10-CM | POA: Diagnosis present

## 2016-11-06 DIAGNOSIS — Z8619 Personal history of other infectious and parasitic diseases: Secondary | ICD-10-CM | POA: Diagnosis not present

## 2016-11-06 LAB — URINALYSIS, ROUTINE W REFLEX MICROSCOPIC
Bilirubin Urine: NEGATIVE
Glucose, UA: NEGATIVE mg/dL
HGB URINE DIPSTICK: NEGATIVE
Ketones, ur: 80 mg/dL — AB
NITRITE: NEGATIVE
PROTEIN: 30 mg/dL — AB
Specific Gravity, Urine: 1.014 (ref 1.005–1.030)
pH: 8 (ref 5.0–8.0)

## 2016-11-06 LAB — CBC
HCT: 30.6 % — ABNORMAL LOW (ref 36.0–46.0)
Hemoglobin: 9.7 g/dL — ABNORMAL LOW (ref 12.0–15.0)
MCH: 24.2 pg — ABNORMAL LOW (ref 26.0–34.0)
MCHC: 31.7 g/dL (ref 30.0–36.0)
MCV: 76.3 fL — ABNORMAL LOW (ref 78.0–100.0)
PLATELETS: 270 10*3/uL (ref 150–400)
RBC: 4.01 MIL/uL (ref 3.87–5.11)
RDW: 16.4 % — AB (ref 11.5–15.5)
WBC: 4.6 10*3/uL (ref 4.0–10.5)

## 2016-11-06 LAB — PHOSPHORUS: Phosphorus: 3.6 mg/dL (ref 2.5–4.6)

## 2016-11-06 LAB — HEPATIC FUNCTION PANEL
ALK PHOS: 57 U/L (ref 38–126)
ALT: 10 U/L — AB (ref 14–54)
AST: 12 U/L — ABNORMAL LOW (ref 15–41)
Albumin: 3 g/dL — ABNORMAL LOW (ref 3.5–5.0)
BILIRUBIN INDIRECT: 0.4 mg/dL (ref 0.3–0.9)
Bilirubin, Direct: 0.1 mg/dL (ref 0.1–0.5)
Total Bilirubin: 0.5 mg/dL (ref 0.3–1.2)
Total Protein: 7 g/dL (ref 6.5–8.1)

## 2016-11-06 LAB — COMPREHENSIVE METABOLIC PANEL
ALT: 12 U/L — ABNORMAL LOW (ref 14–54)
AST: 14 U/L — ABNORMAL LOW (ref 15–41)
Albumin: 4 g/dL (ref 3.5–5.0)
Alkaline Phosphatase: 75 U/L (ref 38–126)
Anion gap: 10 (ref 5–15)
BUN: 9 mg/dL (ref 6–20)
CHLORIDE: 105 mmol/L (ref 101–111)
CO2: 22 mmol/L (ref 22–32)
Calcium: 9.2 mg/dL (ref 8.9–10.3)
Creatinine, Ser: 0.33 mg/dL — ABNORMAL LOW (ref 0.44–1.00)
GFR calc non Af Amer: >60 mL/min (ref >60)
Glucose, Bld: 85 mg/dL (ref 65–99)
POTASSIUM: 4 mmol/L (ref 3.5–5.1)
SODIUM: 137 mmol/L (ref 135–145)
Total Bilirubin: 0.7 mg/dL (ref 0.3–1.2)
Total Protein: 8.9 g/dL — ABNORMAL HIGH (ref 6.5–8.1)

## 2016-11-06 LAB — MAGNESIUM: Magnesium: 1.7 mg/dL (ref 1.7–2.4)

## 2016-11-06 LAB — CREATININE, SERUM
Creatinine, Ser: 0.35 mg/dL — ABNORMAL LOW (ref 0.44–1.00)
GFR calc non Af Amer: >60 mL/min (ref >60)

## 2016-11-06 LAB — CBC WITH DIFFERENTIAL/PLATELET
Basophils Absolute: 0 10*3/uL (ref 0.0–0.1)
Basophils Relative: 0 %
Eosinophils Absolute: 0.1 10*3/uL (ref 0.0–0.7)
Eosinophils Relative: 1 %
HEMATOCRIT: 38.9 % (ref 36.0–46.0)
HEMOGLOBIN: 12.3 g/dL (ref 12.0–15.0)
LYMPHS ABS: 1.5 10*3/uL (ref 0.7–4.0)
Lymphocytes Relative: 17 %
MCH: 24 pg — AB (ref 26.0–34.0)
MCHC: 31.6 g/dL (ref 30.0–36.0)
MCV: 76 fL — ABNORMAL LOW (ref 78.0–100.0)
MONOS PCT: 6 %
Monocytes Absolute: 0.6 10*3/uL (ref 0.1–1.0)
NEUTROS PCT: 76 %
Neutro Abs: 6.6 10*3/uL (ref 1.7–7.7)
Platelets: 359 10*3/uL (ref 150–400)
RBC: 5.12 MIL/uL — ABNORMAL HIGH (ref 3.87–5.11)
RDW: 16.4 % — ABNORMAL HIGH (ref 11.5–15.5)
WBC: 8.7 10*3/uL (ref 4.0–10.5)

## 2016-11-06 LAB — PROTIME-INR
INR: 1.24
PROTHROMBIN TIME: 15.7 s — AB (ref 11.4–15.2)

## 2016-11-06 LAB — I-STAT CG4 LACTIC ACID, ED
LACTIC ACID, VENOUS: 1.54 mmol/L (ref 0.5–1.9)
LACTIC ACID, VENOUS: 0.56 mmol/L (ref 0.5–1.9)

## 2016-11-06 LAB — SEDIMENTATION RATE: Sed Rate: 70 mm/hr — ABNORMAL HIGH (ref 0–22)

## 2016-11-06 LAB — VITAMIN B12: Vitamin B-12: 300 pg/mL (ref 180–914)

## 2016-11-06 LAB — APTT: APTT: 40 s — AB (ref 24–36)

## 2016-11-06 LAB — PROCALCITONIN: PROCALCITONIN: 0.12 ng/mL

## 2016-11-06 MED ORDER — ENOXAPARIN SODIUM 40 MG/0.4ML ~~LOC~~ SOLN
40.0000 mg | SUBCUTANEOUS | Status: DC
  Administered 2016-11-06: 40 mg via SUBCUTANEOUS
  Filled 2016-11-06 (×2): qty 0.4

## 2016-11-06 MED ORDER — FENTANYL CITRATE (PF) 100 MCG/2ML IJ SOLN
50.0000 ug | Freq: Once | INTRAMUSCULAR | Status: AC
  Administered 2016-11-06: 50 ug via INTRAVENOUS
  Filled 2016-11-06: qty 2

## 2016-11-06 MED ORDER — ONDANSETRON HCL 4 MG PO TABS: 4.0000 mg | ORAL_TABLET | Freq: Four times a day (QID) | ORAL | Status: DC | PRN

## 2016-11-06 MED ORDER — ACETAMINOPHEN 500 MG PO TABS
1000.0000 mg | ORAL_TABLET | Freq: Once | ORAL | Status: AC
  Administered 2016-11-06: 1000 mg via ORAL
  Filled 2016-11-06: qty 2

## 2016-11-06 MED ORDER — HYDROMORPHONE HCL 1 MG/ML IJ SOLN
1.0000 mg | INTRAMUSCULAR | Status: DC | PRN
  Administered 2016-11-06 – 2016-11-09 (×16): 1 mg via INTRAVENOUS
  Filled 2016-11-06 (×16): qty 1

## 2016-11-06 MED ORDER — ONDANSETRON HCL 4 MG/2ML IJ SOLN
4.0000 mg | Freq: Once | INTRAMUSCULAR | Status: AC
  Administered 2016-11-06: 4 mg via INTRAVENOUS
  Filled 2016-11-06: qty 2

## 2016-11-06 MED ORDER — LEVOFLOXACIN IN D5W 750 MG/150ML IV SOLN: 750.0000 mg | Freq: Once | INTRAVENOUS | Status: DC

## 2016-11-06 MED ORDER — ACETAMINOPHEN 650 MG RE SUPP: 650.0000 mg | Freq: Four times a day (QID) | RECTAL | Status: DC | PRN

## 2016-11-06 MED ORDER — VITAMIN B-12 1000 MCG PO TABS
1000.0000 ug | ORAL_TABLET | Freq: Every day | ORAL | Status: DC
  Administered 2016-11-06 – 2016-11-09 (×4): 1000 ug via ORAL
  Filled 2016-11-06 (×4): qty 1

## 2016-11-06 MED ORDER — HYDROMORPHONE HCL 1 MG/ML IJ SOLN
1.0000 mg | Freq: Once | INTRAMUSCULAR | Status: AC
  Administered 2016-11-06: 1 mg via INTRAVENOUS
  Filled 2016-11-06: qty 1

## 2016-11-06 MED ORDER — KETOROLAC TROMETHAMINE 15 MG/ML IJ SOLN
15.0000 mg | Freq: Three times a day (TID) | INTRAMUSCULAR | Status: DC | PRN
  Administered 2016-11-08 – 2016-11-09 (×4): 15 mg via INTRAVENOUS
  Filled 2016-11-06 (×5): qty 1

## 2016-11-06 MED ORDER — SODIUM CHLORIDE 0.9 % IV BOLUS (SEPSIS)
1000.0000 mL | Freq: Once | INTRAVENOUS | Status: AC
  Administered 2016-11-06: 1000 mL via INTRAVENOUS

## 2016-11-06 MED ORDER — SODIUM CHLORIDE 0.9% FLUSH
10.0000 mL | INTRAVENOUS | Status: DC | PRN
  Administered 2016-11-06 – 2016-11-09 (×2): 10 mL
  Filled 2016-11-06: qty 40

## 2016-11-06 MED ORDER — ACETAMINOPHEN 325 MG PO TABS
650.0000 mg | ORAL_TABLET | Freq: Four times a day (QID) | ORAL | Status: DC | PRN
  Administered 2016-11-07: 650 mg via ORAL
  Filled 2016-11-06: qty 2

## 2016-11-06 MED ORDER — DEXTROSE 5 % IV SOLN
2.0000 g | Freq: Two times a day (BID) | INTRAVENOUS | Status: DC
  Administered 2016-11-06 – 2016-11-09 (×6): 2 g via INTRAVENOUS
  Filled 2016-11-06 (×7): qty 2

## 2016-11-06 MED ORDER — SODIUM CHLORIDE 0.9 % IV SOLN
INTRAVENOUS | Status: DC
  Administered 2016-11-06 – 2016-11-09 (×5): via INTRAVENOUS

## 2016-11-06 MED ORDER — AZTREONAM 2 G IJ SOLR: 2.0000 g | Freq: Once | INTRAMUSCULAR | Status: DC

## 2016-11-06 MED ORDER — ONDANSETRON HCL 4 MG/2ML IJ SOLN
4.0000 mg | Freq: Four times a day (QID) | INTRAMUSCULAR | Status: DC | PRN
  Administered 2016-11-07 – 2016-11-09 (×3): 4 mg via INTRAVENOUS
  Filled 2016-11-06 (×3): qty 2

## 2016-11-06 MED ORDER — SODIUM CHLORIDE 0.9% FLUSH
3.0000 mL | Freq: Two times a day (BID) | INTRAVENOUS | Status: DC
  Administered 2016-11-06: 3 mL via INTRAVENOUS

## 2016-11-06 NOTE — Progress Notes (Addendum)
Pharmacy Antibiotic Note  Kathryn Ewing is a 31 y.o. female admitted on 11/06/2016 with sepsis from urinary tract infection.  Pharmacy has been consulted for Cefepime dosing.  Plan:  Cefepime 2 g IV q12 hr  Follow clinical course, renal function, culture results as available  Follow for de-escalation of antibiotics and LOT    Height: 5\' 4"  (162.6 cm) Weight: 145 lb (65.8 kg) IBW/kg (Calculated) : 54.7  Temp (24hrs), Avg:101.7 F (38.7 C), Min:100.4 F (38 C), Max:102.9 F (39.4 C)   Recent Labs Lab 11/06/16 1349 11/06/16 1403 11/06/16 1647  WBC 8.7  --   --   CREATININE 0.33*  --   --   LATICACIDVEN  --  1.54 0.56    Estimated Creatinine Clearance: 97.7 mL/min (by C-G formula based on SCr of 0.33 mg/dL (L)).    Allergies  Allergen Reactions  . Tramadol Nausea And Vomiting, Rash and Other (See Comments)    Reaction:  Vision changes    . Amoxicillin Nausea And Vomiting and Other (See Comments)    Has patient had a PCN reaction causing immediate rash, facial/tongue/throat swelling, SOB or lightheadedness with hypotension: No Has patient had a PCN reaction causing severe rash involving mucus membranes or skin necrosis: No Has patient had a PCN reaction that required hospitalization No Has patient had a PCN reaction occurring within the last 10 years: Yes If all of the above answers are "NO", then may proceed with Cephalosporin use.  . Codeine Nausea And Vomiting  . Hydrocodone Nausea And Vomiting  . Meropenem Nausea And Vomiting  . Metronidazole Nausea And Vomiting  . Oxycodone-Acetaminophen Itching  . Piperacillin Sod-Tazobactam So Nausea And Vomiting  . Promethazine Nausea And Vomiting  . Diazepam Rash    Antimicrobials this admission: 3/13 Cefepime >>   Dose adjustments this admission: ---  Microbiology results: 3/13 BCx: sent 3/13 UCx: sent  Recently with proteus (S-Rocephin), pseudomonas (S-cefepime), and MRSA in wound (Jan '18) and Citrobacter  (S-Rocephin) in urine (Dec '17)   Thank you for allowing pharmacy to be a part of this patient's care.  Kathryn Ewing, PharmD, BCPS Pager: [phone] 11/06/2016, 5:38 PM

## 2016-11-06 NOTE — ED Triage Notes (Signed)
PT RECEIVED FROM HOME VIA EMS FOR A POSSIBLE WOUND INFECTION TO THE LEFT BUTTOCK WITH GREEN DRAINAGE AND ODOR X3 DAYS. PT STS SHE STARTED SHIVERING WITH A LOW-GRADE FEVER YESTERDAY. PT IS A BILATERAL BKA. PT STS SHE IS NOT ON ANY ABX AT THIS TIME.

## 2016-11-06 NOTE — ED Provider Notes (Signed)
[facility]-EMERGENCY DEPT Provider Note   CSN: [id number] Arrival date & time: 11/06/16  1230     History   Chief Complaint Chief Complaint  Patient presents with  . Wound Infection    HPI Haadiya Frogge is a 31 y.o. female presenting via EMS with ischial ulcer that is draining purulent discharge for 3 days. She reports chills and low-grade fever. She was prescribed a course of antibiotic and finished 2 weeks ago. She describes intense pain radiating into her back and spasm into her legs from the wound. She is reporting dizziness, nausea, nearly syncopal from the pain. She has not been able to eat and drink in the last 3 days. She also reported milky-colored urine. She does not have a  PCP and has been caring for the wound herself the past 2 months. She has had home health changing the dressing for her central line but is now without any home health for the past 2 weeks.  HPI  Past Medical History:  Diagnosis Date  . Amputee   . Amputee, below knee, left (HCC)   . Amputee, below knee, right (HCC)   . Anemia, iron deficiency 09/05/2016  . B12 deficiency 09/05/2016  . Osteomyelitis (HCC)   . S/P flap graft     Patient Active Problem List   Diagnosis Date Noted  . Pseudomonas infection   . Chronic osteomyelitis (HCC)   . Urinary tract infection without hematuria   . B12 deficiency 09/05/2016  . Anemia, iron deficiency 09/05/2016  . Decubitus ulcer of ischial area, left, stage IV (HCC) 09/04/2016  . Chronic osteomyelitis of hip (HCC): Subacute on chronic left ischial osteomyelitis 08/02/2016  . Tachycardia 08/02/2016  . Lower urinary tract infectious disease 08/02/2016  . Pressure injury of skin 08/02/2016  . S/P bilateral BKA (below knee amputation) (HCC)   . Amputee, below knee, right (HCC)   . Neurogenic bladder 05/10/2016  . History of osteomyelitis 04/17/2016  . 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 Depression 11/02/2015  . Anxiety 11/02/2015  . Chronic pain due to  trauma 09/29/2014  . Decreased mobility 07/20/2014  . MSSA (methicillin susceptible Staphylococcus aureus) infection 07/16/2014  . Anemia of infection and chronic disease 10/22/2012    Past Surgical History:  Procedure Laterality Date  . BACK SURGERY    . CESAREAN SECTION    . FREE FLAP GRAFT      OB History    No data available       Home Medications    Prior to Admission medications   Medication Sig Start Date End Date Taking? Authorizing Provider  bisacodyl (DULCOLAX) 5 MG EC tablet Take 1 tablet (5 mg total) by mouth daily as needed for moderate constipation. Patient not taking: Reported on 10/16/2016 09/09/16   Rodolph Bong, MD  cyanocobalamin (,VITAMIN B-12,) 1000 MCG/ML injection Inject 1 mL (1,000 mcg total) into the muscle daily. Take daily for 3 days, then weekly for 1 month, then monthly. Patient not taking: Reported on 10/16/2016 09/10/16   Rodolph Bong, MD  diphenhydrAMINE (BENADRYL) 25 mg capsule Take 1 capsule (25 mg total) by mouth every 6 (six) hours as needed for itching or allergies. Patient not taking: Reported on 10/16/2016 09/09/16   Rodolph Bong, MD  HYDROcodone-acetaminophen (NORCO/VICODIN) 5-325 MG tablet Take 1 tablet by mouth every 4 (four) hours as needed. 10/16/16   Barrett Henle, PA-C  iron polysaccharides (NIFEREX) 150 MG capsule Take 1 capsule (150 mg total) by mouth daily. Patient  not taking: Reported on 10/16/2016 09/10/16   Rodolph Bonganiel Thompson V, MD  LORazepam (ATIVAN) 1 MG tablet Take 1 tablet (1 mg total) by mouth 2 (two) times daily as needed for anxiety. Patient not taking: Reported on 10/16/2016 09/09/16   Rodolph Bonganiel Thompson V, MD  methocarbamol (ROBAXIN) 500 MG tablet Take 1 tablet (500 mg total) by mouth every 6 (six) hours as needed for muscle spasms. Patient not taking: Reported on 10/16/2016 09/09/16   Rodolph Bonganiel Thompson V, MD  ondansetron (ZOFRAN ODT) 4 MG disintegrating tablet Take 1 tablet (4 mg total) by mouth every 8 (eight)  hours as needed for nausea or vomiting. 10/16/16   Barrett HenleNicole Elizabeth Nadeau, PA-C  ondansetron (ZOFRAN) 4 MG tablet Take 1 tablet (4 mg total) by mouth every 6 (six) hours as needed for nausea. Patient not taking: Reported on 10/16/2016 09/09/16   Rodolph Bonganiel Thompson V, MD  oxyCODONE 10 MG TABS Take 1 tablet (10 mg total) by mouth every 4 (four) hours as needed for moderate pain. Patient not taking: Reported on 10/16/2016 09/09/16   Rodolph Bonganiel Thompson V, MD  polyethylene glycol [facility](MIRALAX / Ethelene HalGLYCOLAX) packet Take 17 g by mouth daily. Patient not taking: Reported on 10/16/2016 09/09/16   Rodolph Bonganiel Thompson V, MD  senna-docusate (SENOKOT-S) 8.6-50 MG tablet Take 1 tablet by mouth 2 (two) times daily. Patient not taking: Reported on 10/16/2016 09/09/16   Rodolph Bonganiel Thompson V, MD    Family History Family History  Problem Relation Age of Onset  . Diabetes Mellitus II Other     Social History Social History  Substance Use Topics  . Smoking status: Former Games developermoker  . Smokeless tobacco: Never Used  . Alcohol use No     Allergies   Tramadol; Amoxicillin; Codeine; Hydrocodone; Meropenem; Metronidazole; Oxycodone-acetaminophen; Piperacillin sod-tazobactam so; Promethazine; and Diazepam   Review of Systems Review of Systems  Constitutional: Positive for chills and fever.  HENT: Negative for congestion, ear pain and sore throat.   Eyes: Negative for visual disturbance.  Respiratory: Negative for cough, chest tightness, shortness of breath, wheezing and stridor.   Cardiovascular: Negative for chest pain, palpitations and leg swelling.  Gastrointestinal: Positive for nausea. Negative for abdominal pain and vomiting.  Genitourinary: Positive for dysuria. Negative for flank pain and hematuria.       Reports milky colored urine   Musculoskeletal: Negative for gait problem, neck pain and neck stiffness.  Skin: Negative for rash.  Neurological: Positive for dizziness and light-headedness. Negative for speech difficulty,  weakness and headaches.  Psychiatric/Behavioral: Negative for behavioral problems.     Physical Exam Updated Vital Signs BP 124/68 (BP Location: Right Arm)   Pulse 114   Temp 100.4 F (38 C) (Oral)   Resp 20   Ht 5\' 4"  (1.626 m)   Wt 65.8 kg   LMP 11/04/2016   SpO2 99%   BMI 24.89 kg/m   Physical Exam  Constitutional: She appears well-developed and well-nourished. No distress.  Patient is pale and uncomfortable appearing has a low-grade fever  Neck: Normal range of motion.  Cardiovascular: Normal rate, regular rhythm and normal heart sounds.   Pulmonary/Chest: Effort normal and breath sounds normal. No respiratory distress. She has no wheezes. She has no rales.  Neurological: She is alert.  Skin: Skin is warm and dry. She is not diaphoretic. There is pallor.  Psychiatric: She has a normal mood and affect. Her behavior is normal.  Nursing note and vitals reviewed.    ED Treatments / Results  Labs (all labs  ordered are listed, but only abnormal results are displayed) Labs Reviewed  CULTURE, BLOOD (ROUTINE X 2)  CULTURE, BLOOD (ROUTINE X 2)  COMPREHENSIVE METABOLIC PANEL  CBC WITH DIFFERENTIAL/PLATELET  URINALYSIS, ROUTINE W REFLEX MICROSCOPIC  I-STAT CG4 LACTIC ACID, ED    EKG  EKG Interpretation None       Radiology Dg Chest 2 View  Result Date: 11/06/2016 CLINICAL DATA:  Fever and chills beginning yesterday. EXAM: CHEST  2 VIEW COMPARISON:  09/04/2016 FINDINGS: The heart size and mediastinal contours are within normal limits. Both lungs are clear. No evidence of pneumothorax or pleural effusion. Right jugular central venous catheter remains in appropriate position. Posterior spinal fixation hardware again seen in lower thoracic and lumbar spine. IMPRESSION: No active cardiopulmonary disease. Electronically Signed   By: Myles Rosenthal M.D.   On: 11/06/2016 13:40    Procedures Procedures (including critical care time)  Medications Ordered in ED Medications    HYDROmorphone (DILAUDID) injection 1 mg (not administered)  ondansetron (ZOFRAN) injection 4 mg (not administered)     Initial Impression / Assessment and Plan / ED Course  I have reviewed the triage vital signs and the nursing notes.  Pertinent labs & imaging results that were available during my care of the patient were reviewed by me and considered in my medical decision making (see chart for details).     Patient presents with pain and drainage from nonhealing initial ulcer recently treated with antibiotics. She has a history of osteomyelitis and bilateral BKA. She does not have home health at this time or PCP following her. She is attempting to care for her ulcer on her own and had to call EMS today to be evaluated in ED. On exam patient is pale appearing and uncomfortable.  Lungs clear and equal bilaterally without wheezing or crackles.  Ordered Zofran and pain management prior to moving patient for complete exam and visualizing wound. IV fluids initiated. Chest x-ray without acute cardiopulmonary disease and showing proper placement of central line.  Transferred patient care at end of shift to Dr. Hyacinth Meeker pending completion of physical exam and labs. Anticipate admission for further treatment likely involving IV antibiotic, fluids and pain management.  Final Clinical Impressions(s) / ED Diagnoses   Final diagnoses:  None    New Prescriptions New Prescriptions   No medications on file     Gregary Cromer 11/06/16 1803    Eber Hong, MD 11/07/16 [phone]

## 2016-11-06 NOTE — ED Provider Notes (Signed)
The patient is a 31 year old female who has been the unfortunate victim of multiple terrible infections which have caused terrible leg infections, osteomyelitis and ultimately bilateral amputations of the lower extremities. She has developed significant fascial decubitus which she is taking care of by her self packing and cleaning it and keeping it covered with a dressing is best that she can. She has a central line in her right chest which appears to be a double catheter which she was using box for recent infection. She has been released from her antibiotics and firm her home health but has recently developed increased fever, vomiting for 3 days and is noticed that her urine has become milky and cloudy. She reports that she has to self cath at home. She is otherwise incontinent.  Review of systems is otherwise negative for 10 systems.  Physical exam shows that the patient is uncomfortable appearing, she is mildly dehydrated by mucous membranes, she has a soft abdomen which is minimal tender in the lower abdomen. Her lungs and heart sounds are unremarkable without wheezing rhonchi or rales. She has a catheter in the right upper chest which appears clean, there is no surrounding cellulitis or drainage or purulence from the wound. I have inspected with a chaperone at the bedside her perineum including the initial decubitus which appears very clean, there is no purulence or foul smell to it. It has a nice clean pink granulated tissue. The patient does have a fever, she has a likely significant infection that will require admission to the hospital. She will get a Foley catheter as she is totally incontinent of urine and this is the only way to collect urine. Check rectal temperature, Tylenol for fever, IV fluids.  The patient does not have any signs of hypotension, she does not have a leukocytosis however she does have a fever of 102.9 and a tachycardia which has been persistent. Her lactic acid is normal, her  repeat was 0.56, she does not appear to be in shock. Due to the fact that she has both a urinary tract infection as well as a catheter in her right upper chest and would consider that she could be bacteremic. She will be given antibiotics to cover for potential sepsis. She will be admitted to the hospital.  CXR neg for acute findings  After fluids, and symptomatic meds, the pt still has sx but better with regards to her nausea.  Ongoing tachycardia.  Vitals:   11/06/16 1301 11/06/16 1556 11/06/16 1626  BP: 124/68  (!) 120/49  Pulse: 114  111  Resp: 20  17  Temp: 100.4 F (38 C) 102.9 F (39.4 C)   TempSrc: Oral Rectal   SpO2: 99%  100%  Weight: 145 lb (65.8 kg)    Height: 5\' 4"  (1.626 m)     Paged the hospitalist at 5:25 PM - d/w hospitalist - will admit  CRITICAL CARE Performed by: Vida RollerBrian D Wylie Russon Total critical care time: 35 minutes Critical care time was exclusive of separately billable procedures and treating other patients. Critical care was necessary to treat or prevent imminent or life-threatening deterioration. Critical care was time spent personally by me on the following activities: development of treatment plan with patient and/or surrogate as well as nursing, discussions with consultants, evaluation of patient's response to treatment, examination of patient, obtaining history from patient or surrogate, ordering and performing treatments and interventions, ordering and review of laboratory studies, ordering and review of radiographic studies, pulse oximetry and re-evaluation of patient's condition.  Medical screening examination/treatment/procedure(s) were conducted as a shared visit with non-physician practitioner(s) and myself.  I personally evaluated the patient during the encounter.  Clinical Impression:   Final diagnoses:  Sepsis, due to unspecified organism [facility])  Acute cystitis without hematuria         Eber Hong, MD 11/06/16 1810

## 2016-11-06 NOTE — ED Notes (Signed)
PER JESSICA M., PA. WE CAN USE THE CVC LINE. PROPER PLACEMENT CONFIRMED BY X-RAY.

## 2016-11-06 NOTE — H&P (Signed)
History and Physical    Kathryn Ewing PFX:[id number] DOB: 03/27/1986 DOA: 11/06/2016  PCP: No PCP Per Patient   I have briefly reviewed patients previous medical reports in [facility].  Patient coming from: Home  Chief Complaint: fever, nausea, vomiting, increase suppuration from decubitus ulcer  HPI: Kathryn Ewing is a 31 y/o woman with bilateral BKA, T9-paraplegia, anxiety, depression, chronic osteomyelitis, neurogenic bladder and B12 deficiency; who presented to ED with complaints of fever, nausea/vomting, worsening pain in her lower back, increase suppuration of her wound and milky color urine. Patient has had associated anorexia. She reported symptoms started approx 3-4 days prior to admission and is now unable to keep things down and with high grade temp. No blood in the emesis reported. Patient endorses no cough, no SOB, no HA's, no melena, no hematochezia, no blurred vision and no CP.  Patient w/o PCP and for the last 2 weeks not even further [facility] services as per her reports.  ED Course: urine cx, blood cx and lactic acid drawn; patient received IVF's and was started on IV antibiotics. [facility] called to admit patient for further evaluation and treatment .  Review of Systems:  Negative except as otherwise mentioned on HPI.  Past Medical History:  Diagnosis Date  . Amputee   . Amputee, below knee, left ([place])   . Amputee, below knee, right ([place])   . Anemia, iron deficiency 09/05/2016  . B12 deficiency 09/05/2016  . Osteomyelitis ([place])   . S/P flap graft     Past Surgical History:  Procedure Laterality Date  . BACK SURGERY    . CESAREAN SECTION    . FREE FLAP GRAFT      Social History  reports that she has quit smoking. She has never used smokeless tobacco. She reports that she uses drugs, including Marijuana. She reports that she does not drink alcohol.  Allergies  Allergen Reactions  . Tramadol Nausea And Vomiting, Rash and Other (See Comments)    Reaction:  Vision  changes    . Amoxicillin Nausea And Vomiting and Other (See Comments)    Has patient had a PCN reaction causing immediate rash, facial/tongue/throat swelling, SOB or lightheadedness with hypotension: No Has patient had a PCN reaction causing severe rash involving mucus membranes or skin necrosis: No Has patient had a PCN reaction that required hospitalization No Has patient had a PCN reaction occurring within the last 10 years: Yes If all of the above answers are "NO", then may proceed with Cephalosporin use.  . Codeine Nausea And Vomiting  . Hydrocodone Nausea And Vomiting  . Meropenem Nausea And Vomiting  . Metronidazole Nausea And Vomiting  . Oxycodone-Acetaminophen Itching  . Piperacillin Sod-Tazobactam So Nausea And Vomiting  . Promethazine Nausea And Vomiting  . Diazepam Rash    Family History  Problem Relation Age of Onset  . Diabetes Mellitus II Other      Prior to Admission medications   Not on File    Physical Exam: Vitals:   11/06/16 1556 11/06/16 1626 11/06/16 1700 11/06/16 1853  BP:  (!) 120/49 106/59 106/56  Pulse:  111 113 115  Resp:  17  13  Temp: 102.9 F (39.4 C)   99 F (37.2 C)  TempSrc: Rectal   Oral  SpO2:  100% 98% 97%  Weight:      Height:       Constitutional: patient warm to touch, no CP, no SOB. Reports having pain in her ischial area and lower spine (appears  to be neuropathic pain; per patient not new) Eyes: PERTLA, lids and conjunctivae normal, no exudates ENMT: Mucous membranes slightly dry, no thrush. Posterior pharynx clear of any exudate or lesions. Normal dentition.  Neck: supple, no masses, no thyromegaly, no JVD Respiratory: clear to auscultation bilaterally, no wheezing, no crackles. Normal respiratory effort.  Cardiovascular: tahycardia, positive S1 & S2 heard, no rubs, no gallops. No carotid bruits.  Abdomen: No distension, no tenderness, positive BS. Musculoskeletal: good range of motion in her upper extremities. Positive  bilateral BKA. Skin: positive stage 4 ischial ulcer; no foul odor and just serosanguineous drainage. Right chest with subclavian line catheter in place; no drainage appreciated, no erythema, tenderness or pocketing around line. Neurologic: CN 2-12 grossly intact. Moving upper extremities w/o problem. Reports decrease sensation below belly button. Bilateral BKA>.  Psychiatric: Normal judgment and insight. Alert and oriented x 3. Flat mood.    Labs on Admission: I have personally reviewed following labs and imaging studies  CBC:  Recent Labs Lab 11/06/16 1349  WBC 8.7  NEUTROABS 6.6  HGB 12.3  HCT 38.9  MCV 76.0*  PLT 403   Basic Metabolic Panel:  Recent Labs Lab 11/06/16 1349  NA 137  K 4.0  CL 105  CO2 22  GLUCOSE 85  BUN 9  CREATININE 0.33*  CALCIUM 9.2   Liver Function Tests:  Recent Labs Lab 11/06/16 1349  AST 14*  ALT 12*  ALKPHOS 75  BILITOT 0.7  PROT 8.9*  ALBUMIN 4.0   Urine analysis:    Component Value Date/Time   COLORURINE YELLOW 11/06/2016 1300   APPEARANCEUR HAZY (A) 11/06/2016 1300   LABSPEC 1.014 11/06/2016 1300   PHURINE 8.0 11/06/2016 1300   GLUCOSEU NEGATIVE 11/06/2016 1300   HGBUR NEGATIVE 11/06/2016 1300   BILIRUBINUR NEGATIVE 11/06/2016 1300   KETONESUR 80 (A) 11/06/2016 1300   PROTEINUR 30 (A) 11/06/2016 1300   NITRITE NEGATIVE 11/06/2016 1300   LEUKOCYTESUR MODERATE (A) 11/06/2016 1300     Radiological Exams on Admission: Dg Chest 2 View  Result Date: 11/06/2016 CLINICAL DATA:  Fever and chills beginning yesterday. EXAM: CHEST  2 VIEW COMPARISON:  09/04/2016 FINDINGS: The heart size and mediastinal contours are within normal limits. Both lungs are clear. No evidence of pneumothorax or pleural effusion. Right jugular central venous catheter remains in appropriate position. Posterior spinal fixation hardware again seen in lower thoracic and lumbar spine. IMPRESSION: No active cardiopulmonary disease. Electronically Signed   By:  Earle Gell M.D.   On: 11/06/2016 13:40    EKG:  -none   Assessment/Plan 1-Sepsis (HCC):appears to be secondary to UTI; but other sources to be considered as well (decubitus ulcer and potential line infection). Met Criteria with fever, tachycardia and presumed source (urine). -sepsis protocol initiated in ED -patient with normal lactic acid -will check procalcitonin, ESR and CRP -blood cx's and urine cx's collected -ID consulted and per recommendations started on IV cefepime  -WOC consulted for recommendations on wound care -will provide IVF's and follow clinical response -PRN analgesics and antipyretics to be provided.  2-S/P bilateral BKA (below knee amputation) ([place]) -due to skin infections and osteomyelitis on her legs. -stumps looking good  3-Decubitus ulcer of ischial area, left, stage IV [facility]): with hx of chronic osteomyelitis -serosanguineous drainage appreciated -no purulence seen  -WOC consulted -Overlay mattress ordered -will follow rewound care rec's and provide preventive measures  4-B12 deficiency: -will repeat B12 -continue daily B12 PO for now  5-Anemia, iron deficiency: -Hgb appears stable -will  monitor trend  6-Urinary tract infection without hematuria: patient with Neurogenic bladder -chronic in/out cath by herself -treatment as mentioned above -will follow cx's reports -continue ABX's and provide IVF's  7-Paraplegia at T9 level ([place]) -Essentially bed-bound   8-Depression and anxiety -no SI or hallucinations  -not on any meds at home -will monitor  9-Chronic pain due to trauma -will use PRN Toradol and for severe pain PRN dilaudid     Time: 70 minutes   DVT prophylaxis: Lovenox  Code Status: Full Family Communication: no family at bedside   Disposition Plan: to be determined; anticipate > 2 midnights in the hospital.  Consults called: ID (Dr. Linus Salmons) and [place]  Admission status: inpatient, > 2 midnights; telemetry bed   Barton Dubois  MD [facility] Pager [phone]  If 7PM-7AM, please contact night-coverage [email or web address] Password [facility]  11/06/2016, 6:58 PM

## 2016-11-06 NOTE — Progress Notes (Signed)
Pt refuses to wear telemetry even when made aware of risks, notified floor coverage [place], told to make rounding MD aware in am.

## 2016-11-06 NOTE — Progress Notes (Signed)
Pt will not tolerate air mattress alternating between firmness, tried to lock the bed on firm but would not work, tried to call for another one to try but was told there were no more. Pt refusing to stay on bed because at times it makes her feel like she is sitting on metal. Pt is being placed back on regular bed.

## 2016-11-06 NOTE — ED Notes (Signed)
Bed: WA21 Expected date:  Expected time:  Means of arrival:  Comments: EMS-wound infection

## 2016-11-06 NOTE — ED Notes (Signed)
WILL TRANSPORT PT TO 4E 1413-1. AAOX4. PT IN NO APPARENT DISTRESS.  THE OPPORTUNITY TO ASK QUESTIONS WAS PROVIDED.

## 2016-11-07 DIAGNOSIS — G8220 Paraplegia, unspecified: Secondary | ICD-10-CM

## 2016-11-07 DIAGNOSIS — L89154 Pressure ulcer of sacral region, stage 4: Secondary | ICD-10-CM

## 2016-11-07 DIAGNOSIS — Z880 Allergy status to penicillin: Secondary | ICD-10-CM

## 2016-11-07 DIAGNOSIS — Z885 Allergy status to narcotic agent status: Secondary | ICD-10-CM

## 2016-11-07 DIAGNOSIS — M4628 Osteomyelitis of vertebra, sacral and sacrococcygeal region: Secondary | ICD-10-CM

## 2016-11-07 DIAGNOSIS — R509 Fever, unspecified: Secondary | ICD-10-CM

## 2016-11-07 DIAGNOSIS — R634 Abnormal weight loss: Secondary | ICD-10-CM

## 2016-11-07 DIAGNOSIS — Z87891 Personal history of nicotine dependence: Secondary | ICD-10-CM

## 2016-11-07 DIAGNOSIS — Z888 Allergy status to other drugs, medicaments and biological substances status: Secondary | ICD-10-CM

## 2016-11-07 DIAGNOSIS — Z881 Allergy status to other antibiotic agents status: Secondary | ICD-10-CM

## 2016-11-07 DIAGNOSIS — Z833 Family history of diabetes mellitus: Secondary | ICD-10-CM

## 2016-11-07 DIAGNOSIS — Z95828 Presence of other vascular implants and grafts: Secondary | ICD-10-CM

## 2016-11-07 DIAGNOSIS — Z8619 Personal history of other infectious and parasitic diseases: Secondary | ICD-10-CM

## 2016-11-07 DIAGNOSIS — R7881 Bacteremia: Secondary | ICD-10-CM

## 2016-11-07 DIAGNOSIS — A419 Sepsis, unspecified organism: Principal | ICD-10-CM

## 2016-11-07 LAB — C-REACTIVE PROTEIN: CRP: 13.4 mg/dL — ABNORMAL HIGH (ref <1.0)

## 2016-11-07 LAB — MRSA PCR SCREENING: MRSA by PCR: NEGATIVE

## 2016-11-07 MED ORDER — PREMIER PROTEIN SHAKE
11.0000 [oz_av] | Freq: Two times a day (BID) | ORAL | Status: DC | PRN
  Filled 2016-11-07: qty 325.31

## 2016-11-07 MED ORDER — VANCOMYCIN HCL IN DEXTROSE 1-5 GM/200ML-% IV SOLN
1000.0000 mg | Freq: Once | INTRAVENOUS | Status: AC
  Administered 2016-11-07: 1000 mg via INTRAVENOUS
  Filled 2016-11-07: qty 200

## 2016-11-07 MED ORDER — CYCLOBENZAPRINE HCL 10 MG PO TABS
10.0000 mg | ORAL_TABLET | Freq: Once | ORAL | Status: AC
  Administered 2016-11-07: 10 mg via ORAL
  Filled 2016-11-07: qty 1

## 2016-11-07 MED ORDER — VANCOMYCIN HCL IN DEXTROSE 750-5 MG/150ML-% IV SOLN
750.0000 mg | Freq: Three times a day (TID) | INTRAVENOUS | Status: DC
  Administered 2016-11-08 – 2016-11-09 (×5): 750 mg via INTRAVENOUS
  Filled 2016-11-07 (×5): qty 150

## 2016-11-07 NOTE — Progress Notes (Signed)
Patient refused to be placed on air mattress bed.

## 2016-11-07 NOTE — Consult Note (Addendum)
[facility]  Date of Admission:  11/06/2016  Date of Consult:  11/07/2016  Reason for Consult: decubitus wound, osteomyelitis Referring Physician: [place]  Impression/Recommendation Sepsis present on adm Appears resolved.  Check HIV and acute hepatitis panel if not done in last 12 months.   Decubitus Wound Nutrition f/u Air mattress (pt has refused). Unable to afford at home.  WOC (inpt and outpt)  Osteomyelitis, fever Will continue cefepime Will add vanco given her prev Cx.  Watch her port Await her BCx, UCx.   Nutrition 22# wt loss in last month.  Alb 3.0 Appreciate nutrition eval  Paraplegia  Thank you so much for this interesting consult,   Bobby Rumpf (pager) [phone] [email or web address]  Kathryn Ewing is an 31 y.o. female.  HPI: 31 yo F with hx of T9 paraplegia, bilateral BKA, chronic osteomyelitis/decubitus ulcer, neurogenic bladder, brought to ED on 3-13 with worsening lower back pain, n/v for 3-4 days pta. Temp was 102.9. WBC 8.7, UA mod LE/nitr -. TNTC WBC. Procalcitonin negative.   UCx, BCx pending.    She was prev hospitalized in January 2018- seen by ID. Her osteo was unchanged on MRI. She was eval by surgery. She was given 4 weeks of cefepime and was to f/u in ID clinic (did not make).   A wound swab at that time grew MRSA, Pseudomonas, Proteus.  She has previously been [facility].   Past Medical History:  Diagnosis Date  . Amputee   . Amputee, below knee, left ([place])   . Amputee, below knee, right ([place])   . Anemia, iron deficiency 09/05/2016  . B12 deficiency 09/05/2016  . Osteomyelitis ([place])   . S/P flap graft     Past Surgical History:  Procedure Laterality Date  . BACK SURGERY    . CESAREAN SECTION    . FREE FLAP GRAFT       Allergies  Allergen Reactions  . Tramadol Nausea And Vomiting, Rash and Other (See Comments)    Reaction:  Vision changes    . Amoxicillin Nausea And Vomiting  and Other (See Comments)    Has patient had a PCN reaction causing immediate rash, facial/tongue/throat swelling, SOB or lightheadedness with hypotension: No Has patient had a PCN reaction causing severe rash involving mucus membranes or skin necrosis: No Has patient had a PCN reaction that required hospitalization No Has patient had a PCN reaction occurring within the last 10 years: Yes If all of the above answers are "NO", then may proceed with Cephalosporin use.  . Codeine Nausea And Vomiting  . Hydrocodone Nausea And Vomiting  . Meropenem Nausea And Vomiting  . Metronidazole Nausea And Vomiting  . Oxycodone-Acetaminophen Itching  . Piperacillin Sod-Tazobactam So Nausea And Vomiting  . Promethazine Nausea And Vomiting  . Diazepam Rash    Medications:  Scheduled: . ceFEPime (MAXIPIME) IV  2 g Intravenous Q12H  . enoxaparin (LOVENOX) injection  40 mg Subcutaneous Q24H  . sodium chloride flush  3 mL Intravenous Q12H  . vitamin B-12  1,000 mcg Oral Daily    Abtx:  Anti-infectives    Start     Dose/Rate Route Frequency Ordered Stop   11/06/16 1800  ceFEPIme (MAXIPIME) 2 g in dextrose 5 % 50 mL IVPB     2 g 100 mL/hr over 30 Minutes Intravenous Every 12 hours 11/06/16 1736     11/06/16 1730  levofloxacin (LEVAQUIN) IVPB 750 mg  Status:  Discontinued     750  mg 100 mL/hr over 90 Minutes Intravenous  Once 11/06/16 1718 11/06/16 1736   11/06/16 1730  aztreonam (AZACTAM) 2 g in dextrose 5 % 50 mL IVPB  Status:  Discontinued     2 g 100 mL/hr over 30 Minutes Intravenous  Once 11/06/16 1718 11/06/16 1736      Total days of antibiotics: 1 cefepime          Social History:  reports that she has quit smoking. She has never used smokeless tobacco. She reports that she uses drugs, including Marijuana. She reports that she does not drink alcohol.  Family History  Problem Relation Age of Onset  . Diabetes Mellitus II Other     General ROS: no dysphagia, n/v better. no problems with  port (pain, redness), no foley at home. normal BM, decreased appetite. see HPI.   Blood pressure (!) 118/57, pulse 96, temperature 99 F (37.2 C), temperature source Oral, resp. rate 16, height 5' 4"  (1.626 m), weight 61 kg (134 lb 6.4 oz), last menstrual period 11/04/2016, SpO2 100 %. General appearance: alert, cooperative, no distress and examined with RN, MD Eyes: negative findings: conjunctivae and sclerae normal and pupils equal, round, reactive to light and accomodation Throat: normal findings: oropharynx pink & moist without lesions or evidence of thrush Neck: no adenopathy and supple, symmetrical, trachea midline Lungs: clear to auscultation bilaterally and R chest port- non-tender, clean, no erythema. no fluctuance.  Heart: regular rate and rhythm Abdomen: normal findings: bowel sounds normal and soft, non-tender Extremities: refuses exam of her LE.  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 there is no odor or gross d/c.    Results for orders placed or performed during the hospital encounter of 11/06/16 (from the past 48 hour(s))  Urinalysis, Routine w reflex microscopic     Status: Abnormal   Collection Time: 11/06/16  1:00 PM  Result Value Ref Range   Color, Urine YELLOW YELLOW   APPearance HAZY (A) CLEAR   Specific Gravity, Urine 1.014 1.005 - 1.030   pH 8.0 5.0 - 8.0   Glucose, UA NEGATIVE NEGATIVE mg/dL   Hgb urine dipstick NEGATIVE NEGATIVE   Bilirubin Urine NEGATIVE NEGATIVE   Ketones, ur 80 (A) NEGATIVE mg/dL   Protein, ur 30 (A) NEGATIVE mg/dL   Nitrite NEGATIVE NEGATIVE   Leukocytes, UA MODERATE (A) NEGATIVE   RBC / HPF 0-5 0 - 5 RBC/hpf   WBC, UA TOO NUMEROUS TO COUNT 0 - 5 WBC/hpf   Bacteria, UA RARE (A) NONE SEEN   Squamous Epithelial / LPF 0-5 (A) NONE SEEN   Mucous PRESENT   Vitamin B12     Status: None   Collection Time: 11/06/16  1:48 PM  Result Value Ref Range   Vitamin B-12 300 180 - 914 pg/mL    Comment: (NOTE) This assay is not validated for  testing neonatal or myeloproliferative syndrome specimens for Vitamin B12 levels. Performed at [facility], [place] [address] [place],  [address]   Comprehensive metabolic panel     Status: Abnormal   Collection Time: 11/06/16  1:49 PM  Result Value Ref Range   Sodium 137 135 - 145 mmol/L   Potassium 4.0 3.5 - 5.1 mmol/L   Chloride 105 101 - 111 mmol/L   CO2 22 22 - 32 mmol/L   Glucose, Bld 85 65 - 99 mg/dL   BUN 9 6 - 20 mg/dL   Creatinine, Ser 0.33 (L) 0.44 - 1.00 mg/dL   Calcium  9.2 8.9 - 10.3 mg/dL   Total Protein 8.9 (H) 6.5 - 8.1 g/dL   Albumin 4.0 3.5 - 5.0 g/dL   AST 14 (L) 15 - 41 U/L   ALT 12 (L) 14 - 54 U/L   Alkaline Phosphatase 75 38 - 126 U/L   Total Bilirubin 0.7 0.3 - 1.2 mg/dL   GFR calc non Af Amer >60 >60 mL/min   GFR calc Af Amer >60 >60 mL/min    Comment: (NOTE) The eGFR has been calculated using the CKD EPI equation. This calculation has not been validated in all clinical situations. eGFR's persistently <60 mL/min signify possible Chronic Kidney Disease.    Anion gap 10 5 - 15  CBC with Differential     Status: Abnormal   Collection Time: 11/06/16  1:49 PM  Result Value Ref Range   WBC 8.7 4.0 - 10.5 K/uL   RBC 5.12 (H) 3.87 - 5.11 MIL/uL   Hemoglobin 12.3 12.0 - 15.0 g/dL   HCT 38.9 36.0 - 46.0 %   MCV 76.0 (L) 78.0 - 100.0 fL   MCH 24.0 (L) 26.0 - 34.0 pg   MCHC 31.6 30.0 - 36.0 g/dL   RDW 16.4 (H) 11.5 - 15.5 %   Platelets 359 150 - 400 K/uL   Neutrophils Relative % 76 %   Neutro Abs 6.6 1.7 - 7.7 K/uL   Lymphocytes Relative 17 %   Lymphs Abs 1.5 0.7 - 4.0 K/uL   Monocytes Relative 6 %   Monocytes Absolute 0.6 0.1 - 1.0 K/uL   Eosinophils Relative 1 %   Eosinophils Absolute 0.1 0.0 - 0.7 K/uL   Basophils Relative 0 %   Basophils Absolute 0.0 0.0 - 0.1 K/uL  Blood culture (routine x 2)     Status: None (Preliminary result)   Collection Time: 11/06/16  1:51 PM  Result Value Ref Range   Specimen Description LEFT ANTECUBITAL     Special Requests BOTTLES DRAWN AEROBIC AND ANAEROBIC 5CC    Culture      NO GROWTH < 24 HOURS Performed at [facility], [address], [place], [place] [address]    Report Status PENDING   Blood culture (routine x 2)     Status: None (Preliminary result)   Collection Time: 11/06/16  1:51 PM  Result Value Ref Range   Specimen Description RIGHT ANTECUBITAL    Special Requests IN PEDIATRIC BOTTLE 4CC    Culture      NO GROWTH < 24 HOURS Performed at [facility], [place] [address] [place], [place] [address]    Report Status PENDING   I-Stat CG4 Lactic Acid, ED     Status: None   Collection Time: 11/06/16  2:03 PM  Result Value Ref Range   Lactic Acid, Venous 1.54 0.5 - 1.9 mmol/L  I-Stat CG4 Lactic Acid, ED     Status: None   Collection Time: 11/06/16  4:47 PM  Result Value Ref Range   Lactic Acid, Venous 0.56 0.5 - 1.9 mmol/L  MRSA PCR Screening     Status: None   Collection Time: 11/06/16  9:24 PM  Result Value Ref Range   MRSA by PCR NEGATIVE NEGATIVE    Comment:        The GeneXpert MRSA Assay (FDA approved for NASAL specimens only), is one component of a comprehensive MRSA colonization surveillance program. It is not intended to diagnose MRSA infection nor to guide or monitor treatment for MRSA infections.  Sedimentation rate     Status: Abnormal   Collection Time: 11/06/16 10:29 PM  Result Value Ref Range   Sed Rate 70 (H) 0 - 22 mm/hr  C-reactive protein     Status: Abnormal   Collection Time: 11/06/16 10:29 PM  Result Value Ref Range   CRP 13.4 (H) <1.0 mg/dL    Comment: Performed at [facility], [place] [address] [place], [place] [address]  Procalcitonin     Status: None   Collection Time: 11/06/16 10:29 PM  Result Value Ref Range   Procalcitonin 0.12 ng/mL    Comment:        Interpretation: PCT (Procalcitonin) <= 0.5 ng/mL: Systemic infection (sepsis) is not likely. Local bacterial infection is possible. (NOTE)         ICU PCT  Algorithm               Non ICU PCT Algorithm    ----------------------------     ------------------------------         PCT < 0.25 ng/mL                 PCT < 0.1 ng/mL     Stopping of antibiotics            Stopping of antibiotics       strongly encouraged.               strongly encouraged.    ----------------------------     ------------------------------       PCT level decrease by               PCT < 0.25 ng/mL       >= 80% from peak PCT       OR PCT 0.25 - 0.5 ng/mL          Stopping of antibiotics                                             encouraged.     Stopping of antibiotics           encouraged.    ----------------------------     ------------------------------       PCT level decrease by              PCT >= 0.25 ng/mL       < 80% from peak PCT        AND PCT >= 0.5 ng/mL            Continuin g antibiotics                                              encouraged.       Continuing antibiotics            encouraged.    ----------------------------     ------------------------------     PCT level increase compared          PCT > 0.5 ng/mL         with peak PCT AND          PCT >= 0.5 ng/mL             Escalation of antibiotics  strongly encouraged.      Escalation of antibiotics        strongly encouraged.   Protime-INR     Status: Abnormal   Collection Time: 11/06/16 10:29 PM  Result Value Ref Range   Prothrombin Time 15.7 (H) 11.4 - 15.2 seconds   INR 1.24   APTT     Status: Abnormal   Collection Time: 11/06/16 10:29 PM  Result Value Ref Range   aPTT 40 (H) 24 - 36 seconds    Comment:        IF BASELINE aPTT IS ELEVATED, SUGGEST PATIENT RISK ASSESSMENT BE USED TO DETERMINE APPROPRIATE ANTICOAGULANT THERAPY.   CBC     Status: Abnormal   Collection Time: 11/06/16 10:29 PM  Result Value Ref Range   WBC 4.6 4.0 - 10.5 K/uL   RBC 4.01 3.87 - 5.11 MIL/uL   Hemoglobin 9.7 (L) 12.0 - 15.0 g/dL    Comment: DELTA CHECK  NOTED REPEATED TO VERIFY    HCT 30.6 (L) 36.0 - 46.0 %   MCV 76.3 (L) 78.0 - 100.0 fL   MCH 24.2 (L) 26.0 - 34.0 pg   MCHC 31.7 30.0 - 36.0 g/dL   RDW 16.4 (H) 11.5 - 15.5 %   Platelets 270 150 - 400 K/uL  Creatinine, serum     Status: Abnormal   Collection Time: 11/06/16 10:29 PM  Result Value Ref Range   Creatinine, Ser 0.35 (L) 0.44 - 1.00 mg/dL   GFR calc non Af Amer >60 >60 mL/min   GFR calc Af Amer >60 >60 mL/min    Comment: (NOTE) The eGFR has been calculated using the CKD EPI equation. This calculation has not been validated in all clinical situations. eGFR's persistently <60 mL/min signify possible Chronic Kidney Disease.   Magnesium     Status: None   Collection Time: 11/06/16 10:29 PM  Result Value Ref Range   Magnesium 1.7 1.7 - 2.4 mg/dL  Phosphorus     Status: None   Collection Time: 11/06/16 10:29 PM  Result Value Ref Range   Phosphorus 3.6 2.5 - 4.6 mg/dL  Hepatic function panel     Status: Abnormal   Collection Time: 11/06/16 10:29 PM  Result Value Ref Range   Total Protein 7.0 6.5 - 8.1 g/dL   Albumin 3.0 (L) 3.5 - 5.0 g/dL   AST 12 (L) 15 - 41 U/L   ALT 10 (L) 14 - 54 U/L   Alkaline Phosphatase 57 38 - 126 U/L   Total Bilirubin 0.5 0.3 - 1.2 mg/dL   Bilirubin, Direct 0.1 0.1 - 0.5 mg/dL   Indirect Bilirubin 0.4 0.3 - 0.9 mg/dL      Component Value Date/Time   SDES LEFT ANTECUBITAL 11/06/2016 1351   SDES RIGHT ANTECUBITAL 11/06/2016 1351   SPECREQUEST BOTTLES DRAWN AEROBIC AND ANAEROBIC 5CC 11/06/2016 1351   SPECREQUEST IN PEDIATRIC BOTTLE 4CC 11/06/2016 1351   CULT  11/06/2016 1351    NO GROWTH < 24 HOURS Performed at [facility], [place] [address], [place], [place] [address]    CULT  11/06/2016 1351    NO GROWTH < 24 HOURS Performed at [facility], [place] [address] [place], [place] [address]    REPTSTATUS PENDING 11/06/2016 1351   REPTSTATUS PENDING 11/06/2016 1351   Dg Chest 2 View  Result Date: 11/06/2016 CLINICAL DATA:   Fever and chills beginning yesterday. EXAM: CHEST  2 VIEW COMPARISON:  09/04/2016 FINDINGS: The heart size and mediastinal contours  are within normal limits. Both lungs are clear. No evidence of pneumothorax or pleural effusion. Right jugular central venous catheter remains in appropriate position. Posterior spinal fixation hardware again seen in lower thoracic and lumbar spine. IMPRESSION: No active cardiopulmonary disease. Electronically Signed   By: Earle Gell M.D.   On: 11/06/2016 13:40   Dg Chest Port 1 View  Result Date: 11/06/2016 CLINICAL DATA:  Sepsis. EXAM: PORTABLE CHEST 1 VIEW COMPARISON:  11/06/2016 FINDINGS: Right central venous catheter with tip over the low SVC. No pneumothorax. Normal heart size and pulmonary vascularity. No focal airspace disease or consolidation in the lungs. Linear scarring in the right lung base. No blunting of costophrenic angles. Mediastinal contours appear intact. Postoperative changes in the thoracolumbar spine. IMPRESSION: No active disease. Electronically Signed   By: Lucienne Capers M.D.   On: 11/06/2016 21:39   Recent Results (from the past 240 hour(s))  Blood culture (routine x 2)     Status: None (Preliminary result)   Collection Time: 11/06/16  1:51 PM  Result Value Ref Range Status   Specimen Description LEFT ANTECUBITAL  Final   Special Requests BOTTLES DRAWN AEROBIC AND ANAEROBIC 5CC  Final   Culture   Final    NO GROWTH < 24 HOURS Performed at [facility], [place] [address] [place], [place] [address]    Report Status PENDING  Incomplete  Blood culture (routine x 2)     Status: None (Preliminary result)   Collection Time: 11/06/16  1:51 PM  Result Value Ref Range Status   Specimen Description RIGHT ANTECUBITAL  Final   Special Requests IN PEDIATRIC BOTTLE 4CC  Final   Culture   Final    NO GROWTH < 24 HOURS Performed at [facility], [place] [address] [place], [place] [address]    Report Status PENDING  Incomplete  MRSA PCR  Screening     Status: None   Collection Time: 11/06/16  9:24 PM  Result Value Ref Range Status   MRSA by PCR NEGATIVE NEGATIVE Final    Comment:        The GeneXpert MRSA Assay (FDA approved for NASAL specimens only), is one component of a comprehensive MRSA colonization surveillance program. It is not intended to diagnose MRSA infection nor to guide or monitor treatment for MRSA infections.       11/07/2016, 4:10 PM     LOS: 1 day    Records and images were personally reviewed where available.

## 2016-11-07 NOTE — Progress Notes (Signed)
Pharmacy Antibiotic Note  Kathryn Ewing is a 31 y.o. female admitted on 11/06/2016 with sepsis from urinary tract infection. Pharmacy has been consulted for Cefepime dosing.  Hx of T9 paraplegia, bilateral BKA, chronic osteomyelitis/decubitus ulcer, neurogenic bladder, brought to ED on 3-13 with worsening lower back pain, n/v for 3-4 days pta. Hospitalized in January 2018- seen by ID. Her osteo was unchanged on MRI. She was eval by surgery. She was given 4 weeks of cefepime and was to f/u in ID clinic (did not make).  A wound swab at that time grew MRSA, Pseudomonas, Proteus.    Today, 11/07/2016: ID adding vancomycin for osteomyelitis with above previous Cx results  Plan:  Continue Cefepime 2 g IV q12 hr  Begin vancomycin 1000 mg IV x 1, then 750 mg IV q8 hr  Check vancomycin trough as appropriate  Daily SCr with q8 hr vancomycin  Follow clinical course, renal function, culture results as available  Follow for de-escalation of antibiotics and LOT    Height: 5\' 4"  (162.6 cm) Weight: 134 lb 6.4 oz (61 kg) IBW/kg (Calculated) : 54.7  Temp (24hrs), Avg:99.7 F (37.6 C), Min:98.7 F (37.1 C), Max:102.5 F (39.2 C)   Recent Labs Lab 11/06/16 1349 11/06/16 1403 11/06/16 1647 11/06/16 2229  WBC 8.7  --   --  4.6  CREATININE 0.33*  --   --  0.35*  LATICACIDVEN  --  1.54 0.56  --     Estimated Creatinine Clearance: 90.4 mL/min (by C-G formula based on SCr of 0.35 mg/dL (L)).    Allergies  Allergen Reactions  . Tramadol Nausea And Vomiting, Rash and Other (See Comments)    Reaction:  Vision changes    . Amoxicillin Nausea And Vomiting and Other (See Comments)    Has patient had a PCN reaction causing immediate rash, facial/tongue/throat swelling, SOB or lightheadedness with hypotension: No Has patient had a PCN reaction causing severe rash involving mucus membranes or skin necrosis: No Has patient had a PCN reaction that required hospitalization No Has patient had a PCN  reaction occurring within the last 10 years: Yes If all of the above answers are "NO", then may proceed with Cephalosporin use.  . Codeine Nausea And Vomiting  . Hydrocodone Nausea And Vomiting  . Meropenem Nausea And Vomiting  . Metronidazole Nausea And Vomiting  . Oxycodone-Acetaminophen Itching  . Piperacillin Sod-Tazobactam So Nausea And Vomiting  . Promethazine Nausea And Vomiting  . Diazepam Rash    Antimicrobials this admission: 3/13 Cefepime >>  3/14 Vancomycin >>  Dose adjustments this admission: ---  Microbiology results: 3/13 BCx: ngtd 3/13 UCx: sent  3/13 MRSA pcr: neg 3/14 BCx: sent Recently with proteus (S-Rocephin), pseudomonas (S-cefepime), and MRSA in wound (Jan '18) and Citrobacter (S-Rocephin) in urine (Dec '17)   Thank you for allowing pharmacy to be a part of this patient's care.  Bernadene Personrew Keandra Medero, PharmD, BCPS Pager: [phone] 11/07/2016, 4:49 PM

## 2016-11-07 NOTE — Consult Note (Signed)
WOC Nurse wound consult note  Reason for Consult: left ischial tuberosity pressure injury, Stage 4. Wound type:Pressure [facility].  Patient is not seeing outpatient wound care.  Is doing daily NS moist gauze dressing.  [place] and has not been able to follow up with Infectious Disease MD.  Pressure Injury POA: Yes Measurement: 4cm x 3.3 cm x 2.4 cm with 2cm circumferential undermining Wound bed:Pal pink, nongranulating moist with  Bone palpable Drainage (amount, consistency, odor) Moderate serous to light yellow with musty odor.  Will provide brief addition of silver alginate packing to address bacterial burden possible in the wound.  Periwound: intact Dressing procedure/placement/frequency: Cleanse wound to left ischium with NS and pat gently dry.  Apply 4x4 square of Aquacel Ag ([facility] # [id number]) to wound bed.  Fill remaining wound depth with fluffed 4x4 gauze.  Cover with ABd pad and tape.  Change daily and PRN soilage.   Will not follow at this time.  Please re-consult if needed.  Maple HudsonKaren Venora Kautzman RN BSN CWON Pager [phone]

## 2016-11-07 NOTE — Progress Notes (Signed)
Pt refused lab draws this am

## 2016-11-07 NOTE — Progress Notes (Signed)
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 Per chart review, pt with N/V PTA and poor appetite reported.  RD attempted to visit with patient and pt refused to talk to RD at this time. Pt states "I don't need a talk to a dietitian". Pt did not answer questions.  Per records, pt ate 100% of a [place] sandwich and soup. However, pt has lost 16 lb since 2/20 (11% wt loss x 3 weeks, significant for time frame).  Will order Premier Protein PRN for patient but really unlikely patient will consume.  Wt Readings from Last 15 Encounters:  11/07/16 134 lb 6.4 oz (61 kg)  10/16/16 150 lb (68 kg)  09/03/16 145 lb (65.8 kg)  08/02/16 145 lb (65.8 kg)    Body mass index is 23.07 kg/m. Patient meets criteria for normal based on current BMI.   Current diet order is regular, patient is consuming approximately 100% of meals at this time. Labs and medications reviewed.   No nutrition interventions warranted at this time. If nutrition issues arise, please consult RD.   Tilda FrancoLindsey Kaio Kuhlman, MS, RD, LDN Pager: [phone] After Hours Pager: [phone]
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
## 2016-11-08 LAB — CREATININE, SERUM: Creatinine, Ser: <0.3 mg/dL — ABNORMAL LOW (ref 0.44–1.00)

## 2016-11-08 LAB — PROCALCITONIN: Procalcitonin: <0.1 ng/mL

## 2016-11-08 NOTE — Progress Notes (Signed)
INFECTIOUS DISEASE PROGRESS NOTE  ID: Kathryn CoolerKayla Ewing is a 31 y.o. 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 (HCC)   Depression   Chronic pain due to trauma   Anxiety  Subjective: Still some hip pain No problems with anbx No problems with Port  Abtx:  Anti-infectives    Start     Dose/Rate Route Frequency Ordered Stop   11/08/16 0200  vancomycin (VANCOCIN) IVPB 750 mg/150 ml premix     750 mg 150 mL/hr over 60 Minutes Intravenous Every 8 hours 11/07/16 1710     11/07/16 1800  vancomycin (VANCOCIN) IVPB 1000 mg/200 mL premix     1,000 mg 200 mL/hr over 60 Minutes Intravenous  Once 11/07/16 1710 11/07/16 1911   11/06/16 1800  ceFEPIme (MAXIPIME) 2 g in dextrose 5 % 50 mL IVPB     2 g 100 mL/hr over 30 Minutes Intravenous Every 12 hours 11/06/16 1736     11/06/16 1730  levofloxacin (LEVAQUIN) IVPB 750 mg  Status:  Discontinued     750 mg 100 mL/hr over 90 Minutes Intravenous  Once 11/06/16 1718 11/06/16 1736   11/06/16 1730  aztreonam (AZACTAM) 2 g in dextrose 5 % 50 mL IVPB  Status:  Discontinued     2 g 100 mL/hr over 30 Minutes Intravenous  Once 11/06/16 1718 11/06/16 1736      Medications:  Scheduled: . ceFEPime (MAXIPIME) IV  2 g Intravenous Q12H  . enoxaparin (LOVENOX) injection  40 mg Subcutaneous Q24H  . sodium chloride flush  3 mL Intravenous Q12H  . vancomycin  750 mg Intravenous Q8H  . vitamin B-12  1,000 mcg Oral Daily    Objective: Vital signs in last 24 hours: Temp:  [98.8 F (37.1 C)-99.2 F (37.3 C)] 98.8 F (37.1 C) (03/15 0542) Pulse Rate:  [78-97] 78 (03/15 0542) Resp:  [16] 16 (03/15 0542) BP: (91-118)/(48-57) 91/50 (03/15 0542) SpO2:  [100 %] 100 % (03/15 0542) Weight:  [61 kg  (134 lb 6.4 oz)] 61 kg (134 lb 6.4 oz) (03/15 0500)   General appearance: alert, cooperative and no distress Resp: clear to auscultation bilaterally Chest wall: no tenderness, port clean.  Cardio: regular rate and rhythm GI: normal findings: bowel sounds normal and soft, non-tender  Lab Results  Recent Labs  11/06/16 1349 11/06/16 2229 11/08/16 0424  WBC 8.7 4.6  --   HGB 12.3 9.7*  --   HCT 38.9 30.6*  --   NA 137  --   --   K 4.0  --   --   CL 105  --   --   CO2 22  --   --   BUN 9  --   --   CREATININE 0.33* 0.35* <0.30*   Liver Panel  Recent Labs  11/06/16 1349 11/06/16 2229  PROT 8.9* 7.0  ALBUMIN 4.0 3.0*  AST 14* 12*  ALT 12* 10*  ALKPHOS 75 57  BILITOT 0.7 0.5  BILIDIR  --  0.1  IBILI  --  0.4   Sedimentation Rate  Recent Labs  11/06/16 2229  ESRSEDRATE 70*   C-Reactive Protein  Recent Labs  11/06/16 2229  CRP 13.4*    Microbiology:  Recent Results (from the past 240 hour(s))  Blood culture (routine x 2)     Status: None (Preliminary result)   Collection Time: 11/06/16  1:51 PM  Result Value Ref Range Status   Specimen Description LEFT ANTECUBITAL  Final   Special Requests BOTTLES DRAWN AEROBIC AND ANAEROBIC 5CC  Final   Culture   Final    NO GROWTH < 24 HOURS Performed at [facility], [address] [place], [place] [address]    Report Status PENDING  Incomplete  Blood culture (routine x 2)     Status: None (Preliminary result)   Collection Time: 11/06/16  1:51 PM  Result Value Ref Range Status   Specimen Description RIGHT ANTECUBITAL  Final   Special Requests IN PEDIATRIC BOTTLE 4CC  Final   Culture   Final    NO GROWTH < 24 HOURS Performed at [facility], [address], [place], [place] [address]    Report Status PENDING  Incomplete  MRSA PCR Screening     Status: None   Collection Time: 11/06/16  9:24 PM  Result Value Ref Range Status   MRSA by PCR NEGATIVE NEGATIVE Final    Comment:        The GeneXpert  MRSA Assay (FDA approved for NASAL specimens only), is one component of a comprehensive MRSA colonization surveillance program. It is not intended to diagnose MRSA infection nor to guide or monitor treatment for MRSA infections.     Studies/Results: Dg Chest 2 View  Result Date: 11/06/2016 CLINICAL DATA:  Fever and chills beginning yesterday. EXAM: CHEST  2 VIEW COMPARISON:  09/04/2016 FINDINGS: The heart size and mediastinal contours are within normal limits. Both lungs are clear. No evidence of pneumothorax or pleural effusion. Right jugular central venous catheter remains in appropriate position. Posterior spinal fixation hardware again seen in lower thoracic and lumbar spine. IMPRESSION: No active cardiopulmonary disease. Electronically Signed   By: Myles Rosenthal M.D.   On: 11/06/2016 13:40   Dg Chest Port 1 View  Result Date: 11/06/2016 CLINICAL DATA:  Sepsis. EXAM: PORTABLE CHEST 1 VIEW COMPARISON:  11/06/2016 FINDINGS: Right central venous catheter with tip over the low SVC. No pneumothorax. Normal heart size and pulmonary vascularity. No focal airspace disease or consolidation in the lungs. Linear scarring in the right lung base. No blunting of costophrenic angles. Mediastinal contours appear intact. Postoperative changes in the thoracolumbar spine. IMPRESSION: No active disease. Electronically Signed   By: Burman Nieves M.D.   On: 11/06/2016 21:39     Assessment/Plan: Decubitus Wound Nutrition f/u- she is aware of prev rec's and has been seen again.  Air mattress (pt has refused). Unable to afford at home.  WOC (inpt and outpt)  Osteomyelitis, fever Will continue cefepime, vanco Her fever is resolved.   Watch her port Await her BCx, UCx.   Nutrition 22# wt loss in last month.  Alb 3.0 Appreciate nutrition eval  Paraplegia  HIV (-) Dec 2017.   Day 1 vanco/cefepime         Johny Sax Infectious Diseases (pager) [phone] [email or web address] 11/08/2016, 10:05 AM  LOS: 2 days
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
## 2016-11-08 NOTE — Progress Notes (Signed)
NUTRITION NOTE  Another RD had attempted to see pt yesterday but she was not interested in talking with RD at that time. Reviewed chart, PMH. Per flow sheet and RN report during rounds this AM, pt consumed 100% of breakfast. RN reports pt is having a better day today. RD briefly spoke with pt following rounds. She states that she has met with inpatient and outpatient RDs in the past and has a list at home of foods she should be consuming and nutrients that are beneficial for her considering wound. Pt appreciative but states she does not have any questions at this time and is doing well nutrition-wise.  If nutrition related needs arise, please consult.    Jarome Matin, MS, RD, LDN, [facility] Inpatient Clinical Dietitian Pager # [phone] After hours/weekend pager # [phone]

## 2016-11-09 DIAGNOSIS — R8271 Bacteriuria: Secondary | ICD-10-CM

## 2016-11-09 LAB — CBC WITH DIFFERENTIAL/PLATELET
BASOS ABS: 0 10*3/uL (ref 0.0–0.1)
Basophils Relative: 1 %
EOS ABS: 0.2 10*3/uL (ref 0.0–0.7)
Eosinophils Relative: 7 %
HCT: 27.4 % — ABNORMAL LOW (ref 36.0–46.0)
Hemoglobin: 8.5 g/dL — ABNORMAL LOW (ref 12.0–15.0)
Lymphocytes Relative: 57 %
Lymphs Abs: 1.7 10*3/uL (ref 0.7–4.0)
MCH: 23.6 pg — ABNORMAL LOW (ref 26.0–34.0)
MCHC: 31 g/dL (ref 30.0–36.0)
MCV: 76.1 fL — ABNORMAL LOW (ref 78.0–100.0)
Monocytes Absolute: 0.3 10*3/uL (ref 0.1–1.0)
Monocytes Relative: 12 %
NEUTROS ABS: 0.7 10*3/uL — AB (ref 1.7–7.7)
Neutrophils Relative %: 23 %
PLATELETS: 202 10*3/uL (ref 150–400)
RBC: 3.6 MIL/uL — AB (ref 3.87–5.11)
RDW: 16 % — ABNORMAL HIGH (ref 11.5–15.5)
WBC: 2.9 10*3/uL — AB (ref 4.0–10.5)

## 2016-11-09 LAB — CREATININE, SERUM: Creatinine, Ser: <0.3 mg/dL — ABNORMAL LOW (ref 0.44–1.00)

## 2016-11-09 LAB — COMPREHENSIVE METABOLIC PANEL
ALK PHOS: 61 U/L (ref 38–126)
ALT: 20 U/L (ref 14–54)
ANION GAP: 5 (ref 5–15)
AST: 20 U/L (ref 15–41)
Albumin: 2.6 g/dL — ABNORMAL LOW (ref 3.5–5.0)
BILIRUBIN TOTAL: 0.3 mg/dL (ref 0.3–1.2)
BUN: 7 mg/dL (ref 6–20)
CO2: 23 mmol/L (ref 22–32)
Calcium: 7.8 mg/dL — ABNORMAL LOW (ref 8.9–10.3)
Chloride: 112 mmol/L — ABNORMAL HIGH (ref 101–111)
Creatinine, Ser: 0.33 mg/dL — ABNORMAL LOW (ref 0.44–1.00)
GFR calc non Af Amer: >60 mL/min (ref >60)
GLUCOSE: 89 mg/dL (ref 65–99)
POTASSIUM: 3.3 mmol/L — AB (ref 3.5–5.1)
SODIUM: 140 mmol/L (ref 135–145)
TOTAL PROTEIN: 6.1 g/dL — AB (ref 6.5–8.1)

## 2016-11-09 LAB — URINE CULTURE: Culture: >=100000 — AB

## 2016-11-09 LAB — PATHOLOGIST SMEAR REVIEW

## 2016-11-09 LAB — VANCOMYCIN, TROUGH: VANCOMYCIN TR: 12 ug/mL — AB (ref 15–20)

## 2016-11-09 MED ORDER — VANCOMYCIN HCL 10 G IV SOLR
1250.0000 mg | Freq: Two times a day (BID) | INTRAVENOUS | Status: DC
  Filled 2016-11-09: qty 1250

## 2016-11-09 MED ORDER — HYDROCODONE-ACETAMINOPHEN 5-500 MG PO TABS: 1.0000 | ORAL_TABLET | Freq: Four times a day (QID) | ORAL | 0 refills | Status: AC | PRN

## 2016-11-09 MED ORDER — HEPARIN SOD (PORK) LOCK FLUSH 100 UNIT/ML IV SOLN
250.0000 [IU] | INTRAVENOUS | Status: AC | PRN
  Administered 2016-11-09: 250 [IU]

## 2016-11-09 MED ORDER — VANCOMYCIN IV (FOR PTA / DISCHARGE USE ONLY): 1250.0000 mg | Freq: Two times a day (BID) | INTRAVENOUS | 0 refills | Status: AC

## 2016-11-09 MED ORDER — CEFEPIME IV (FOR PTA / DISCHARGE USE ONLY): 2.0000 g | Freq: Two times a day (BID) | INTRAVENOUS | 0 refills | Status: AC

## 2016-11-09 NOTE — Progress Notes (Signed)
INFECTIOUS DISEASE PROGRESS NOTE  ID: Kathryn Ewing is a 31 y.o. female with  Principal Problem:   Sepsis ([place]) Active Problems:   S/P bilateral BKA (below knee amputation) ([place])   Decubitus ulcer of ischial area, left, stage IV ([facility])   B12 deficiency   Anemia, iron deficiency   Urinary tract infection without hematuria   Neurogenic bladder   Paraplegia at T9 level [facility])   Depression   Chronic pain due to trauma   Anxiety  Subjective: Resting quietly.   Abtx:  Anti-infectives    Start     Dose/Rate Route Frequency Ordered Stop   11/08/16 0200  vancomycin (VANCOCIN) IVPB 750 mg/150 ml premix     750 mg 150 mL/hr over 60 Minutes Intravenous Every 8 hours 11/07/16 1710     11/07/16 1800  vancomycin (VANCOCIN) IVPB 1000 mg/200 mL premix     1,000 mg 200 mL/hr over 60 Minutes Intravenous  Once 11/07/16 1710 11/07/16 1911   11/06/16 1800  ceFEPIme (MAXIPIME) 2 g in dextrose 5 % 50 mL IVPB     2 g 100 mL/hr over 30 Minutes Intravenous Every 12 hours 11/06/16 1736     11/06/16 1730  levofloxacin (LEVAQUIN) IVPB 750 mg  Status:  Discontinued     750 mg 100 mL/hr over 90 Minutes Intravenous  Once 11/06/16 1718 11/06/16 1736   11/06/16 1730  aztreonam (AZACTAM) 2 g in dextrose 5 % 50 mL IVPB  Status:  Discontinued     2 g 100 mL/hr over 30 Minutes Intravenous  Once 11/06/16 1718 11/06/16 1736      Medications:  Scheduled: . ceFEPime (MAXIPIME) IV  2 g Intravenous Q12H  . enoxaparin (LOVENOX) injection  40 mg Subcutaneous Q24H  . sodium chloride flush  3 mL Intravenous Q12H  . vancomycin  750 mg Intravenous Q8H  . vitamin B-12  1,000 mcg Oral Daily    Objective: Vital signs in last 24 hours: Temp:  [98.2 F (36.8 C)-98.6 F (37 C)] 98.6 F (37 C) (03/15 2221) Pulse Rate:  [78-87] 87 (03/15 2221) Resp:  [18] 18 (03/15 2221) BP: (105-107)/(52-57) 107/52 (03/15 2221) SpO2:  [100 %] 100 % (03/15 2221)   General appearance: fatigued and no distress  Lab  Results  Recent Labs  11/06/16 1349 11/06/16 2229 11/08/16 0424 11/09/16 0539  WBC 8.7 4.6  --  2.9*  HGB 12.3 9.7*  --  8.5*  HCT 38.9 30.6*  --  27.4*  NA 137  --   --  140  K 4.0  --   --  3.3*  CL 105  --   --  112*  CO2 22  --   --  23  BUN 9  --   --  7  CREATININE 0.33* 0.35* <0.30* <0.30*  0.33*   Liver Panel  Recent Labs  11/06/16 2229 11/09/16 0539  PROT 7.0 6.1*  ALBUMIN 3.0* 2.6*  AST 12* 20  ALT 10* 20  ALKPHOS 57 61  BILITOT 0.5 0.3  BILIDIR 0.1  --   IBILI 0.4  --    Sedimentation Rate  Recent Labs  11/06/16 2229  ESRSEDRATE 70*   C-Reactive Protein  Recent Labs  11/06/16 2229  CRP 13.4*    Microbiology: Recent Results (from the past 240 hour(s))  Urine culture     Status: Abnormal   Collection Time: 11/06/16  1:00 PM  Result Value Ref Range Status   Specimen Description URINE, CATHETERIZED  Final  Special Requests NONE  Final   Culture >=100,000 COLONIES/mL ESCHERICHIA COLI (A)  Final   Report Status 11/09/2016 FINAL  Final   Organism ID, Bacteria ESCHERICHIA COLI (A)  Final      Susceptibility   Escherichia coli - MIC*    AMPICILLIN >=32 RESISTANT Resistant     CEFAZOLIN 8 SENSITIVE Sensitive     CEFTRIAXONE <=1 SENSITIVE Sensitive     CIPROFLOXACIN <=0.25 SENSITIVE Sensitive     GENTAMICIN <=1 SENSITIVE Sensitive     IMIPENEM <=0.25 SENSITIVE Sensitive     NITROFURANTOIN <=16 SENSITIVE Sensitive     TRIMETH/SULFA <=20 SENSITIVE Sensitive     AMPICILLIN/SULBACTAM >=32 RESISTANT Resistant     PIP/TAZO <=4 SENSITIVE Sensitive     Extended ESBL NEGATIVE Sensitive     * >=100,000 COLONIES/mL ESCHERICHIA COLI  Blood culture (routine x 2)     Status: None (Preliminary result)   Collection Time: 11/06/16  1:51 PM  Result Value Ref Range Status   Specimen Description LEFT ANTECUBITAL  Final   Special Requests BOTTLES DRAWN AEROBIC AND ANAEROBIC 5CC  Final   Culture   Final    NO GROWTH 2 DAYS Performed at [facility], [address], [place], [place] [address]    Report Status PENDING  Incomplete  Blood culture (routine x 2)     Status: None (Preliminary result)   Collection Time: 11/06/16  1:51 PM  Result Value Ref Range Status   Specimen Description RIGHT ANTECUBITAL  Final   Special Requests IN PEDIATRIC BOTTLE 4CC  Final   Culture   Final    NO GROWTH 2 DAYS Performed at [facility], [place] [address] [place], [place] [address]    Report Status PENDING  Incomplete  MRSA PCR Screening     Status: None   Collection Time: 11/06/16  9:24 PM  Result Value Ref Range Status   MRSA by PCR NEGATIVE NEGATIVE Final    Comment:        The GeneXpert MRSA Assay (FDA approved for NASAL specimens only), is one component of a comprehensive MRSA colonization surveillance program. It is not intended to diagnose MRSA infection nor to guide or monitor treatment for MRSA infections.   Culture, blood (x 2)     Status: None (Preliminary result)   Collection Time: 11/07/16  2:31 AM  Result Value Ref Range Status   Specimen Description BLOOD L ARM  Final   Special Requests IN PEDIATRIC BOTTLE 1ML  Final   Culture   Final    NO GROWTH 1 DAY Performed at [facility], [place] [address], [place], [place] [address]    Report Status PENDING  Incomplete  Culture, blood (x 2)     Status: None (Preliminary result)   Collection Time: 11/07/16  2:45 AM  Result Value Ref Range Status   Specimen Description BLOOD R ARM  Final   Special Requests BOTTLES DRAWN AEROBIC AND ANAEROBIC 5.5 ML EA  Final   Culture   Final    NO GROWTH 1 DAY Performed at [facility], [place] [address] [place], [place] [address]    Report Status PENDING  Incomplete    Studies/Results: No results found.   Assessment/Plan: Decubitus Wound Nutrition f/u- she is aware of prev rec's and has been seen again.  Air mattress (pt has refused). Unable to afford at home.  WOC (inpt and outpt)  Osteomyelitis, fever Will continue  cefepime, vanco Her fever  is resolved.   Watch her port Await her BCx Would continue her vanco/cefepime for 6 weeks She can f/u with me in 6 weeks in ID clinic opat pharm consult.   UCx+ E coli Will be coveref by cefipime  Nutrition 22# wt loss in last month.  Alb 3.0 Appreciate nutrition eval.   Drug monitoring Cr stable Probably low as a reflection of her nutrition.   Paraplegia  HIV (-) Dec 2017.   Available as needed.   Day 2 vanco/cefepime  Diagnosis: Decubitus ulcer, osteomyelitis  Culture Result: none  Allergies  Allergen Reactions  . Tramadol Nausea And Vomiting, Rash and Other (See Comments)    Reaction:  Vision changes    . Amoxicillin Nausea And Vomiting and Other (See Comments)    Has patient had a PCN reaction causing immediate rash, facial/tongue/throat swelling, SOB or lightheadedness with hypotension: No Has patient had a PCN reaction causing severe rash involving mucus membranes or skin necrosis: No Has patient had a PCN reaction that required hospitalization No Has patient had a PCN reaction occurring within the last 10 years: Yes If all of the above answers are "NO", then may proceed with Cephalosporin use.  . Codeine Nausea And Vomiting  . Hydrocodone Nausea And Vomiting  . Meropenem Nausea And Vomiting  . Metronidazole Nausea And Vomiting  . Oxycodone-Acetaminophen Itching  . Piperacillin Sod-Tazobactam So Nausea And Vomiting  . Promethazine Nausea And Vomiting  . Diazepam Rash    Discharge antibiotics: vancomycin, cefepime Per pharmacy protocol vancomycin Aim for Vancomycin trough 15-20 (unless otherwise indicated) Duration: 6 weeks End Date: December 10, 2014  [facility] Care Per Protocol:  Labs weekly while on IV antibiotics: __x CBC with differential __ BMP __x CMP _x_ CRP _x_ ESR _x_ Vancomycin trough  _x_ Please pull PIC at completion of IV antibiotics __ Please leave PIC in place until doctor has seen patient or been  notified  Fax weekly labs to [phone]  Clinic Follow Up Appt: Johnnye Sima, 6 weeks         Bobby Rumpf Infectious Diseases (pager) [phone] [email or web address] 11/09/2016, 8:12 AM  LOS: 3 days
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
## 2016-11-09 NOTE — Progress Notes (Addendum)
PHARMACY CONSULT NOTE FOR:  OUTPATIENT  PARENTERAL ANTIBIOTIC THERAPY (OPAT)  Indication: osteomyelitis Regimen: Vancomycin 1250 mg IV q12h (adjusted from 750 mg q8h per low trough level 3/16), Cefepime 2g IV q12h End date: December 09, 2016  IV antibiotic discharge orders are pended. To discharging provider:  please sign these orders via discharge navigator,  Select New Orders & click on the button choice - Manage This Unsigned Work.     Thank you for allowing pharmacy to be a part of this patient's care.  Clance BollRunyon, Jess Toney 11/09/2016, 10:42 AM

## 2016-11-09 NOTE — Progress Notes (Signed)
Pt remains a&ox4. No change from am assessment. Discharge instructions reviewed. Pt concerned about pain management. prescription given.Questions concerns.
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
## 2016-11-09 NOTE — Plan of Care (Signed)
Problem: Education: Goal: Knowledge of [place] General Education information/materials will improve Outcome: Not Progressing Pt lacks readiness to learn at this time, will continue to provide education

## 2016-11-11 LAB — CULTURE, BLOOD (ROUTINE X 2)
CULTURE: NO GROWTH
Culture: NO GROWTH

## 2016-11-12 LAB — CULTURE, BLOOD (ROUTINE X 2)
Culture: NO GROWTH
Culture: NO GROWTH

## 2016-11-14 ENCOUNTER — Telehealth: Payer: Self-pay | Admitting: Infectious Diseases

## 2016-11-14 NOTE — Telephone Encounter (Signed)
Pt refusing further medication from pharmacy.  I asked them to take her PIC out.  Have her rtc

## 2017-04-20 IMAGING — MR MR HIP*L* W/O CM
4 of 6 series · 18 of 40 positions shown · non-contrast
Comparison: 08/20/2016 and 08/03/2016

CLINICAL DATA: Left ischial decubitus ulcer. Assessment for
worsening.

EXAM:
MR OF THE LEFT HIP WITHOUT CONTRAST
TECHNIQUE: Multiplanar, multisequence MR imaging was performed. No intravenous
contrast was administered.

[Series 3: T1 · axial · 4.0mm · 0.74mm/px · z∈[-120,+45]mm · 3 of 51 slices shown (1 of 2)]
[im 9/51]
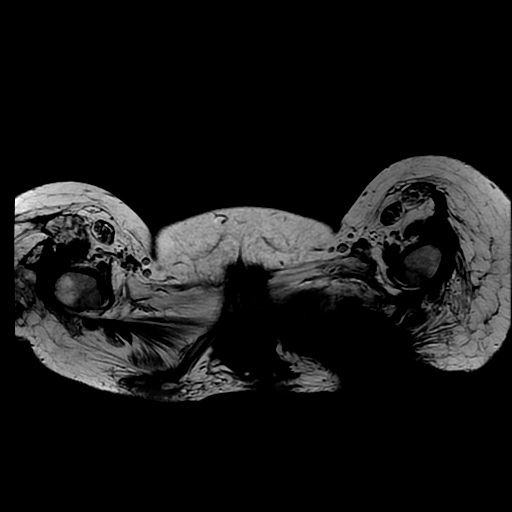
[im 26/51]
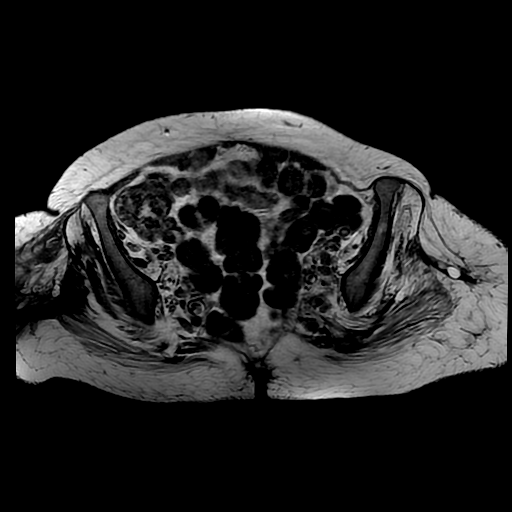
[im 42/51]
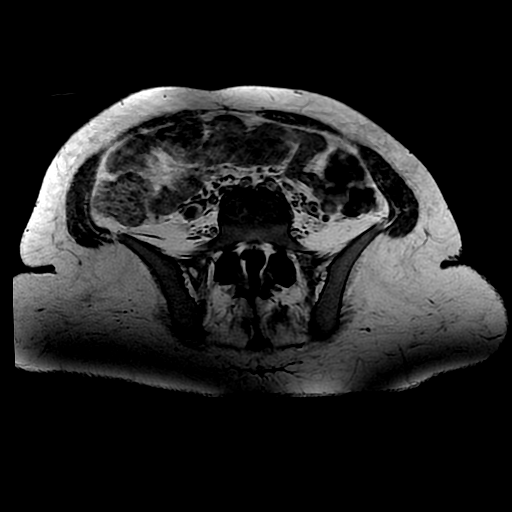

[Series 6: T1 · coronal · 4.0mm · 0.82mm/px · 3 of 40 slices shown (2 of 2)]
[im 8/40]
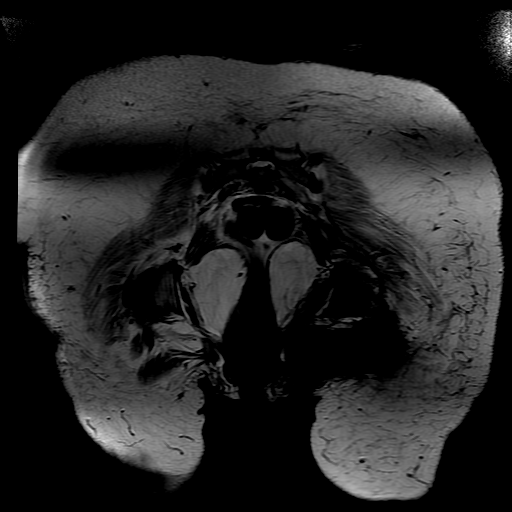
[im 24/40]
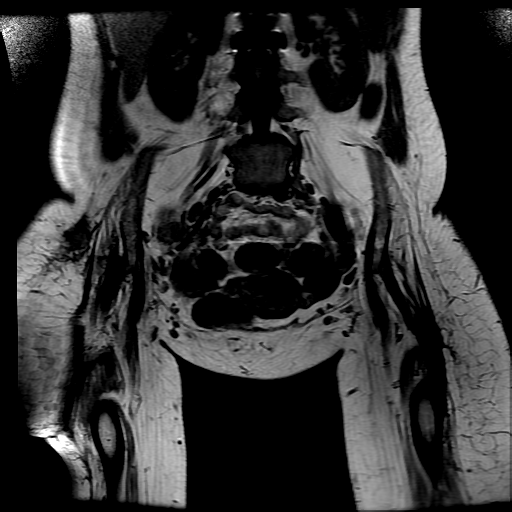
[im 40/40]
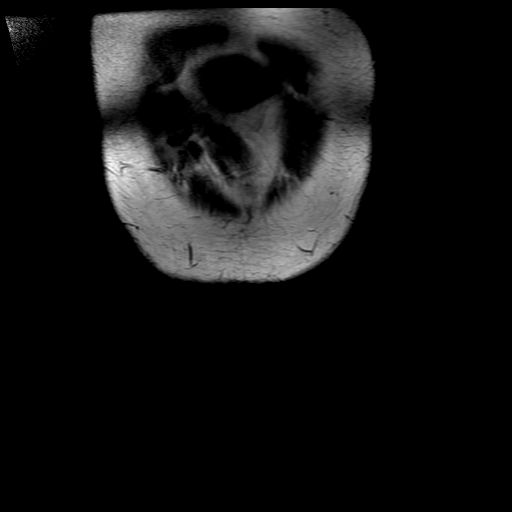

[Series 7: PD · sagittal · 4.0mm · 0.35mm/px · 4 of 29 slices shown]
[im 1/29]
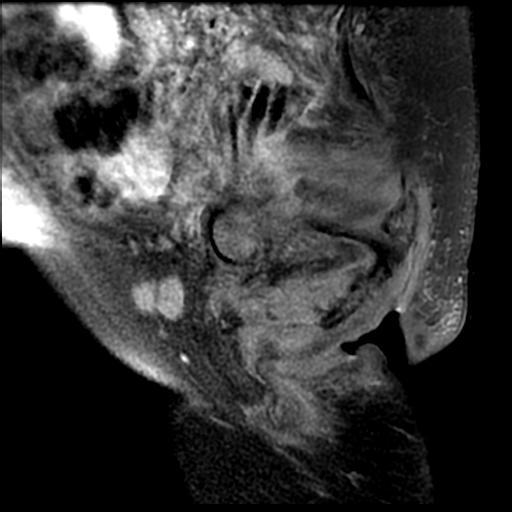
[im 10/29]
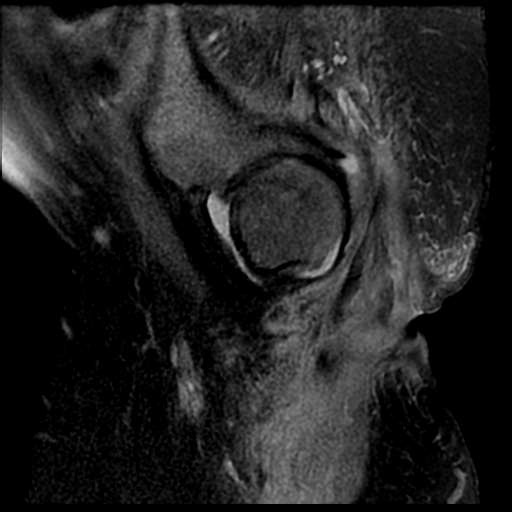
[im 19/29]
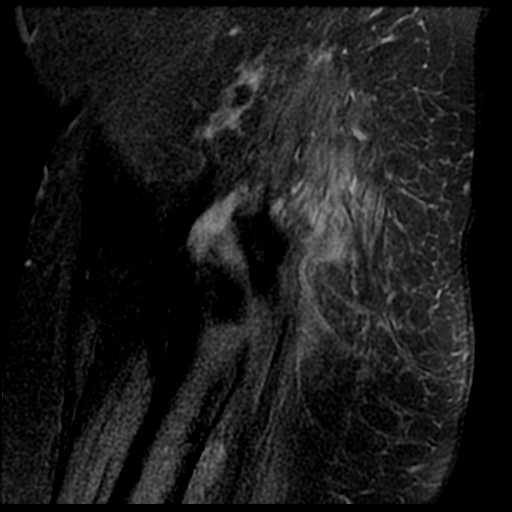
[im 29/29]
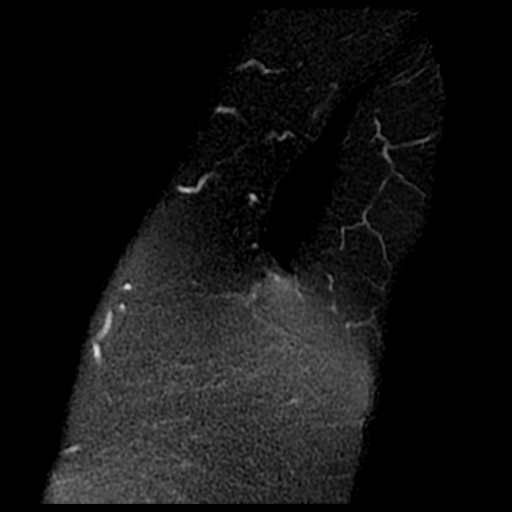

[Series 8: T2 fat-sat · sagittal · 5.0mm · 0.74mm/px · 8 of 58 slices shown]
[im 1/58]
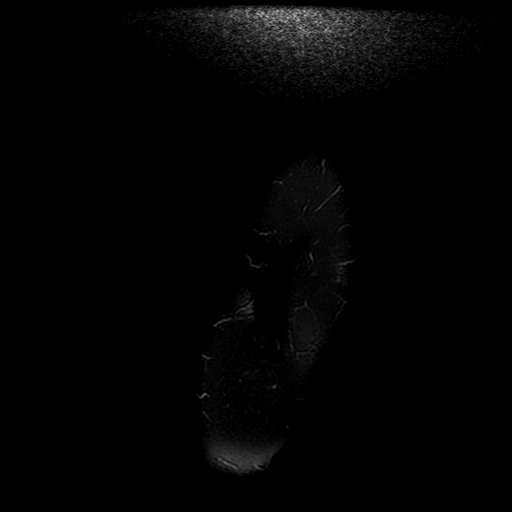
[im 8/58]
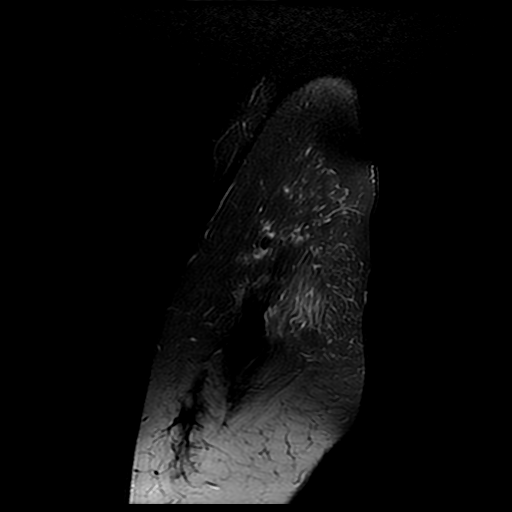
[im 15/58]
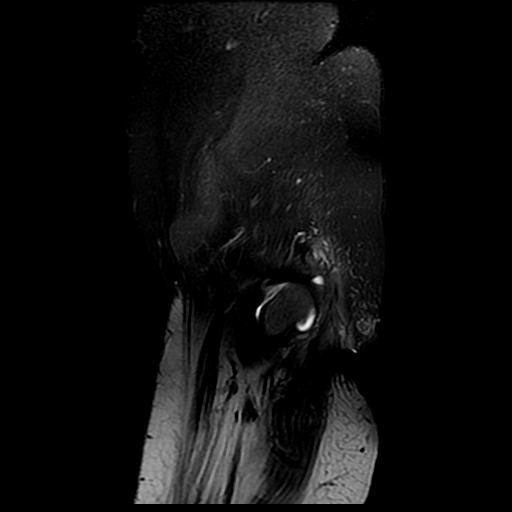
[im 22/58]
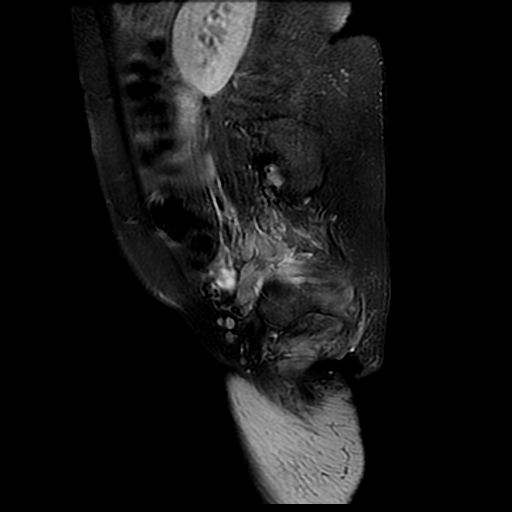
[im 29/58]
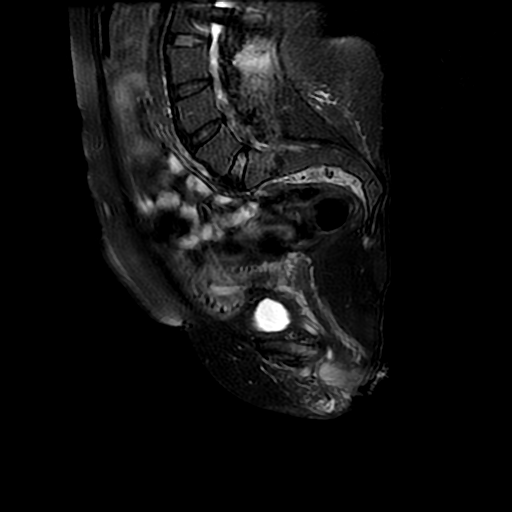
[im 36/58]
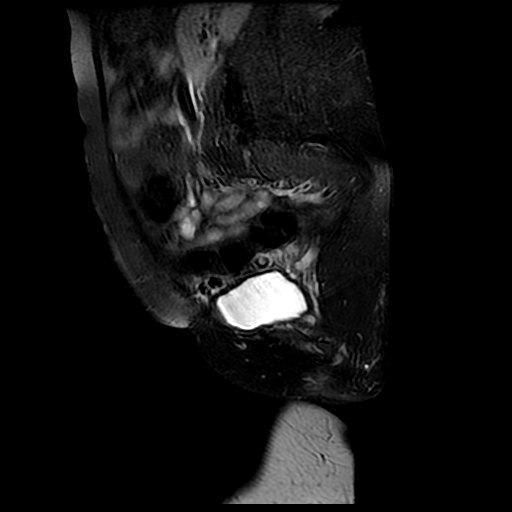
[im 43/58]
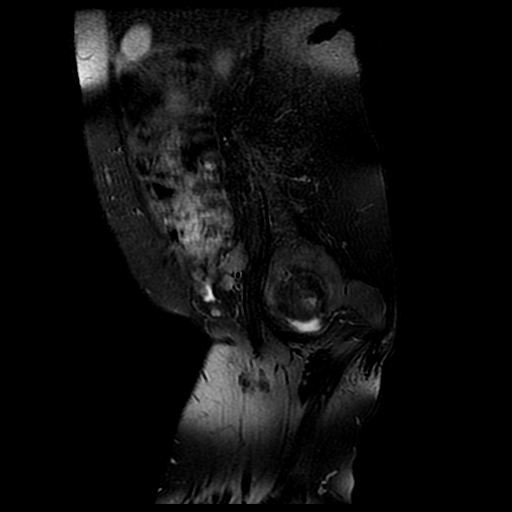
[im 50/58]
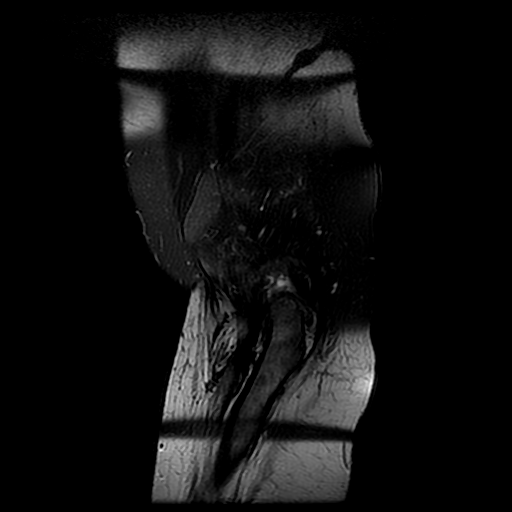

[18 of 40 positions shown; findings below may reference images not displayed]

FINDINGS: Bones: Similar appearance of chronic osteomyelitis involving the
left ischium and posterior wall left acetabulum and with extensive
involvement and destruction of the left inferior pubic ramus. Small
focal area of abnormal osseous edema favoring chronic osteomyelitis
posteriorly in the right ischium, new compared to the prior exam.
Exaggerated lumbosacral carrying angle

Articular cartilage and labrum

Articular cartilage:  Degenerative chondral thinning in both hips.

Labrum:  No definite tear seen.

Joint or bursal effusion

Joint effusion: Bilateral hip joint effusions. Suspected gas density
in the right hip joint. Bilateral draining sinus tracks from the
lateral hips extending to the lateral cutaneous margins.

Bursae:  Small amount of right trochanteric bursitis.

Muscles and tendons

Muscles and tendons: Phlegmon and probably draining sinus tract
extending through the right gluteal musculature laterally. Similar
finding on the left. Abnormal edema in the left hip adductor
musculature, left operator internus, and left proximal hamstring
musculature, roughly similar to the prior exam.

Other findings

Miscellaneous: Reactive adenopathy along the pelvic sidewalls and
inguinal regions. Inflammatory edema extends into the left perineum
and left labia majora. This is similar to the prior exam.
IMPRESSION: 1. Similar appearance of chronic osteomyelitis involving the left
ischium and with destructive findings involving the left inferior
pubic ramus. Large ulceration below the left ischium extending
nearly to the bony margin.
2. New focal edema posteriorly in the right ischium may reflect
early chronic osteomyelitis.
3. Bilateral hip joint effusions with suspected draining sinus
tracks laterally along both hips. Reactive adenopathy in the pelvis.
4. Probable chronic myositis involving the left operator internus
and left hip adductor musculature as well as the left proximal
hamstring musculature, similar to the prior exam.

## 2017-04-20 IMAGING — DX DG CHEST 1V PORT
1 series · 1 of 1 positions shown · non-contrast
Comparison: 08/02/2016

CLINICAL DATA: Cough

EXAM:
PORTABLE CHEST 1 VIEW

[chest ap]
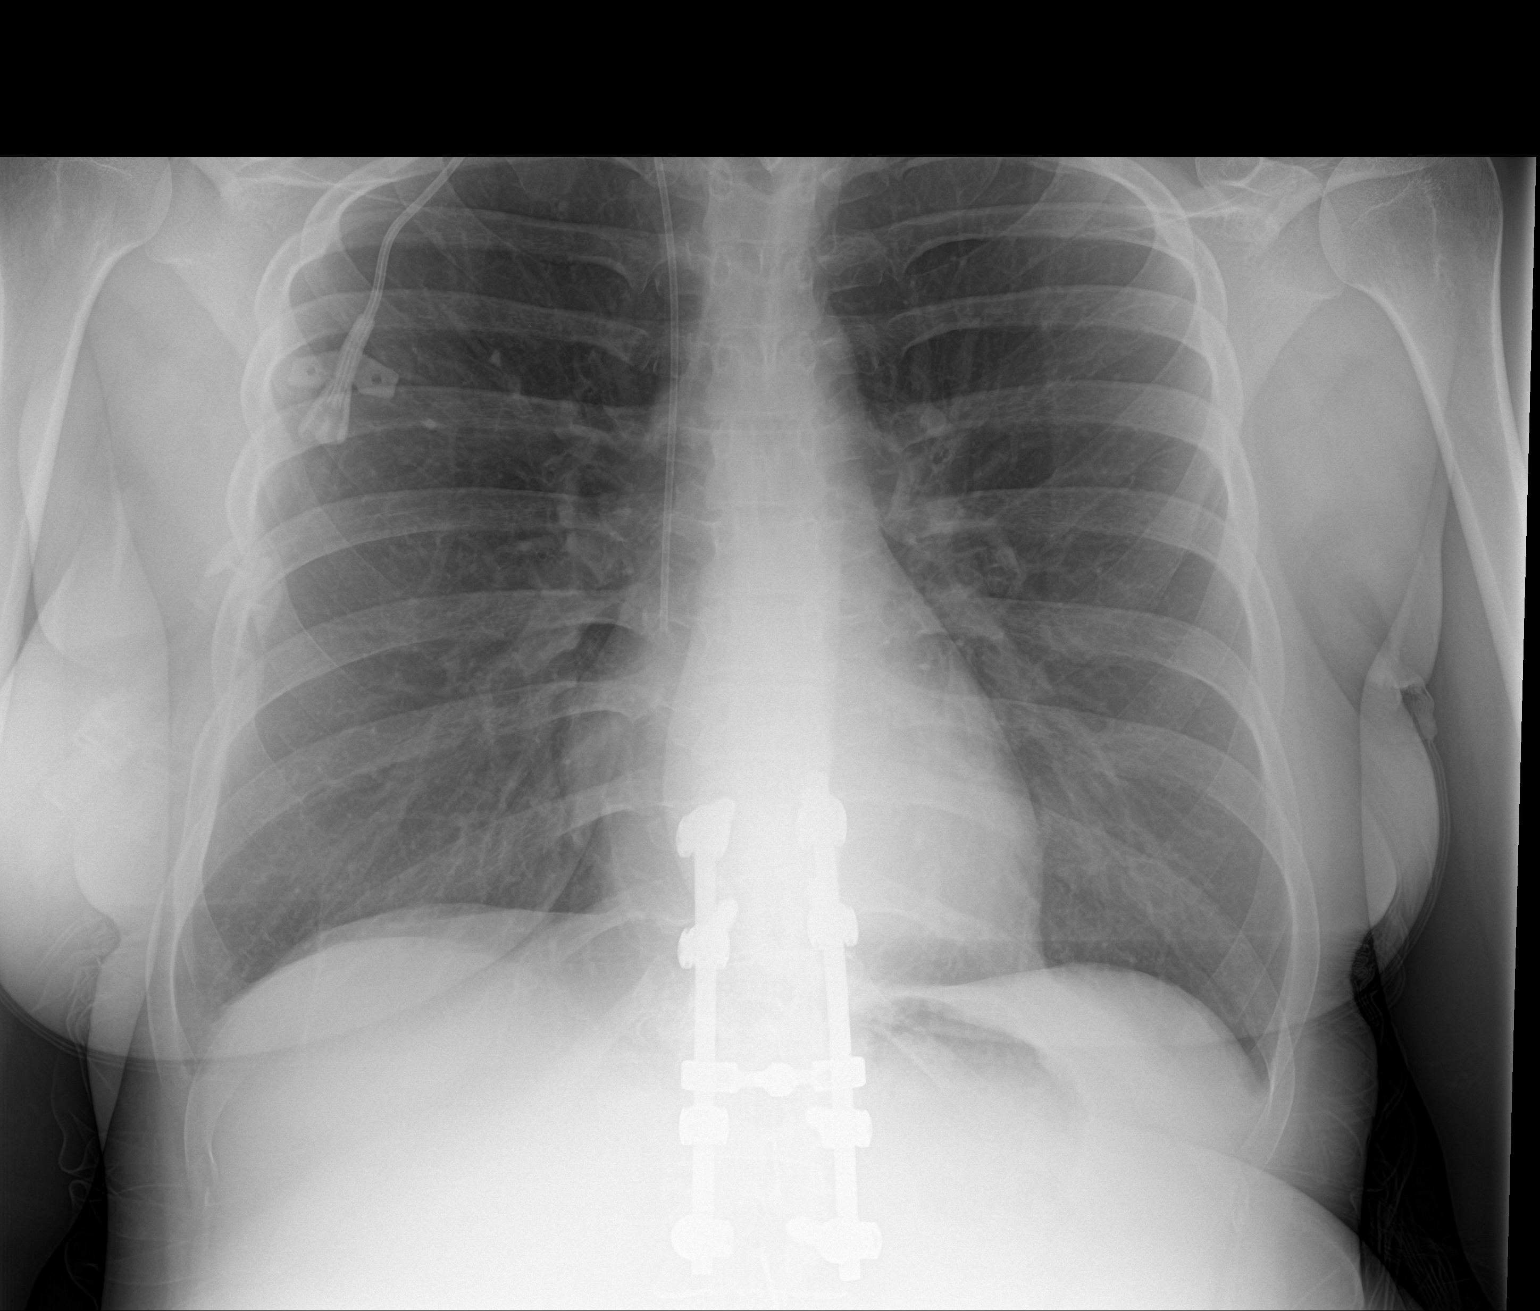

[1 of 1 positions shown; findings below may reference images not displayed]

FINDINGS: Cardiomediastinal silhouette is stable. Right IJ central line with
tip in SVC right atrium junction is unchanged in position. Again
noted metallic fixation rods lower thoracic and upper lumbar spine.
No infiltrate or pleural effusion. No pulmonary edema.
IMPRESSION: No active disease.

## 2017-08-10 ENCOUNTER — Other Ambulatory Visit: Payer: Self-pay

## 2017-08-10 ENCOUNTER — Inpatient Hospital Stay (HOSPITAL_COMMUNITY)
Admission: EM | Admit: 2017-08-10 | Discharge: 2017-08-14 | DRG: 539 | Disposition: A | Discharge status: Home Health | Payer: Medicare Other | Attending: Internal Medicine | Admitting: Internal Medicine

## 2017-08-10 ENCOUNTER — Emergency Department (HOSPITAL_COMMUNITY): Payer: Medicare Other

## 2017-08-10 ENCOUNTER — Encounter (HOSPITAL_COMMUNITY): Payer: Self-pay

## 2017-08-10 DIAGNOSIS — M1990 Unspecified osteoarthritis, unspecified site: Secondary | ICD-10-CM

## 2017-08-10 DIAGNOSIS — Z888 Allergy status to other drugs, medicaments and biological substances status: Secondary | ICD-10-CM | POA: Diagnosis not present

## 2017-08-10 DIAGNOSIS — R32 Unspecified urinary incontinence: Secondary | ICD-10-CM | POA: Diagnosis not present

## 2017-08-10 DIAGNOSIS — D638 Anemia in other chronic diseases classified elsewhere: Secondary | ICD-10-CM | POA: Diagnosis present

## 2017-08-10 DIAGNOSIS — Z881 Allergy status to other antibiotic agents status: Secondary | ICD-10-CM

## 2017-08-10 DIAGNOSIS — M869 Osteomyelitis, unspecified: Secondary | ICD-10-CM | POA: Diagnosis not present

## 2017-08-10 DIAGNOSIS — Z9689 Presence of other specified functional implants: Secondary | ICD-10-CM | POA: Diagnosis not present

## 2017-08-10 DIAGNOSIS — B999 Unspecified infectious disease: Secondary | ICD-10-CM | POA: Diagnosis not present

## 2017-08-10 DIAGNOSIS — N319 Neuromuscular dysfunction of bladder, unspecified: Secondary | ICD-10-CM | POA: Diagnosis present

## 2017-08-10 DIAGNOSIS — F1290 Cannabis use, unspecified, uncomplicated: Secondary | ICD-10-CM | POA: Diagnosis present

## 2017-08-10 DIAGNOSIS — F419 Anxiety disorder, unspecified: Secondary | ICD-10-CM | POA: Diagnosis present

## 2017-08-10 DIAGNOSIS — R634 Abnormal weight loss: Secondary | ICD-10-CM | POA: Diagnosis not present

## 2017-08-10 DIAGNOSIS — G8220 Paraplegia, unspecified: Secondary | ICD-10-CM | POA: Diagnosis present

## 2017-08-10 DIAGNOSIS — Z95818 Presence of other cardiac implants and grafts: Secondary | ICD-10-CM | POA: Diagnosis not present

## 2017-08-10 DIAGNOSIS — Z89512 Acquired absence of left leg below knee: Secondary | ICD-10-CM

## 2017-08-10 DIAGNOSIS — E876 Hypokalemia: Secondary | ICD-10-CM | POA: Diagnosis present

## 2017-08-10 DIAGNOSIS — L89324 Pressure ulcer of left buttock, stage 4: Secondary | ICD-10-CM | POA: Diagnosis present

## 2017-08-10 DIAGNOSIS — R112 Nausea with vomiting, unspecified: Secondary | ICD-10-CM | POA: Diagnosis not present

## 2017-08-10 DIAGNOSIS — Z87891 Personal history of nicotine dependence: Secondary | ICD-10-CM | POA: Diagnosis not present

## 2017-08-10 DIAGNOSIS — Z885 Allergy status to narcotic agent status: Secondary | ICD-10-CM

## 2017-08-10 DIAGNOSIS — F32A Depression, unspecified: Secondary | ICD-10-CM | POA: Diagnosis present

## 2017-08-10 DIAGNOSIS — T8130XA Disruption of wound, unspecified, initial encounter: Secondary | ICD-10-CM

## 2017-08-10 DIAGNOSIS — Z8619 Personal history of other infectious and parasitic diseases: Secondary | ICD-10-CM | POA: Diagnosis not present

## 2017-08-10 DIAGNOSIS — E538 Deficiency of other specified B group vitamins: Secondary | ICD-10-CM | POA: Diagnosis present

## 2017-08-10 DIAGNOSIS — Z6821 Body mass index (BMI) 21.0-21.9, adult: Secondary | ICD-10-CM | POA: Diagnosis not present

## 2017-08-10 DIAGNOSIS — L89159 Pressure ulcer of sacral region, unspecified stage: Secondary | ICD-10-CM | POA: Diagnosis not present

## 2017-08-10 DIAGNOSIS — M86652 Other chronic osteomyelitis, left thigh: Secondary | ICD-10-CM | POA: Diagnosis present

## 2017-08-10 DIAGNOSIS — F329 Major depressive disorder, single episode, unspecified: Secondary | ICD-10-CM | POA: Diagnosis present

## 2017-08-10 DIAGNOSIS — Z89511 Acquired absence of right leg below knee: Secondary | ICD-10-CM

## 2017-08-10 DIAGNOSIS — L89329 Pressure ulcer of left buttock, unspecified stage: Secondary | ICD-10-CM | POA: Diagnosis not present

## 2017-08-10 LAB — CBC WITH DIFFERENTIAL/PLATELET
BASOS ABS: 0 10*3/uL (ref 0.0–0.1)
BASOS PCT: 0 %
Eosinophils Absolute: 0.1 10*3/uL (ref 0.0–0.7)
Eosinophils Relative: 1 %
HEMATOCRIT: 34.4 % — AB (ref 36.0–46.0)
HEMOGLOBIN: 10.9 g/dL — AB (ref 12.0–15.0)
Lymphocytes Relative: 28 %
Lymphs Abs: 3.5 10*3/uL (ref 0.7–4.0)
MCH: 24.2 pg — ABNORMAL LOW (ref 26.0–34.0)
MCHC: 31.7 g/dL (ref 30.0–36.0)
MCV: 76.4 fL — ABNORMAL LOW (ref 78.0–100.0)
Monocytes Absolute: 0.8 10*3/uL (ref 0.1–1.0)
Monocytes Relative: 6 %
NEUTROS ABS: 8.3 10*3/uL — AB (ref 1.7–7.7)
Neutrophils Relative %: 65 %
Platelets: 354 10*3/uL (ref 150–400)
RBC: 4.5 MIL/uL (ref 3.87–5.11)
RDW: 18.3 % — ABNORMAL HIGH (ref 11.5–15.5)
WBC: 12.7 10*3/uL — ABNORMAL HIGH (ref 4.0–10.5)

## 2017-08-10 LAB — COMPREHENSIVE METABOLIC PANEL
ALBUMIN: 4 g/dL (ref 3.5–5.0)
ALT: 5 U/L — AB (ref 14–54)
AST: 13 U/L — AB (ref 15–41)
Alkaline Phosphatase: 60 U/L (ref 38–126)
Anion gap: 10 (ref 5–15)
BUN: 10 mg/dL (ref 6–20)
CHLORIDE: 109 mmol/L (ref 101–111)
CO2: 21 mmol/L — ABNORMAL LOW (ref 22–32)
Calcium: 8.8 mg/dL — ABNORMAL LOW (ref 8.9–10.3)
GLUCOSE: 82 mg/dL (ref 65–99)
POTASSIUM: 3.2 mmol/L — AB (ref 3.5–5.1)
Sodium: 140 mmol/L (ref 135–145)
Total Bilirubin: 1 mg/dL (ref 0.3–1.2)
Total Protein: 7.9 g/dL (ref 6.5–8.1)

## 2017-08-10 LAB — MAGNESIUM: MAGNESIUM: 1.8 mg/dL (ref 1.7–2.4)

## 2017-08-10 LAB — SEDIMENTATION RATE: SED RATE: 50 mm/h — AB (ref 0–22)

## 2017-08-10 LAB — I-STAT CG4 LACTIC ACID, ED: LACTIC ACID, VENOUS: 0.73 mmol/L (ref 0.5–1.9)

## 2017-08-10 MED ORDER — ENOXAPARIN SODIUM 40 MG/0.4ML ~~LOC~~ SOLN
40.0000 mg | SUBCUTANEOUS | Status: DC
  Administered 2017-08-11 – 2017-08-13 (×4): 40 mg via SUBCUTANEOUS
  Filled 2017-08-10 (×4): qty 0.4

## 2017-08-10 MED ORDER — ONDANSETRON HCL 4 MG/2ML IJ SOLN
INTRAMUSCULAR | Status: AC
  Administered 2017-08-10: 4 mg
  Filled 2017-08-10: qty 2

## 2017-08-10 MED ORDER — ENSURE ENLIVE PO LIQD
237.0000 mL | Freq: Two times a day (BID) | ORAL | Status: DC
  Administered 2017-08-11 – 2017-08-14 (×6): 237 mL via ORAL

## 2017-08-10 MED ORDER — VANCOMYCIN HCL IN DEXTROSE 750-5 MG/150ML-% IV SOLN
750.0000 mg | Freq: Two times a day (BID) | INTRAVENOUS | Status: DC
  Administered 2017-08-11: 750 mg via INTRAVENOUS
  Filled 2017-08-10 (×2): qty 150

## 2017-08-10 MED ORDER — POTASSIUM CHLORIDE IN NACL 20-0.9 MEQ/L-% IV SOLN
INTRAVENOUS | Status: AC
  Administered 2017-08-11: via INTRAVENOUS
  Filled 2017-08-10: qty 1000

## 2017-08-10 MED ORDER — DEXTROSE 5 % IV SOLN
2.0000 g | Freq: Three times a day (TID) | INTRAVENOUS | Status: DC
  Administered 2017-08-11 – 2017-08-12 (×6): 2 g via INTRAVENOUS
  Filled 2017-08-10 (×7): qty 2

## 2017-08-10 MED ORDER — ONDANSETRON HCL 4 MG/2ML IJ SOLN
4.0000 mg | Freq: Four times a day (QID) | INTRAMUSCULAR | Status: DC | PRN
  Administered 2017-08-11: 4 mg via INTRAVENOUS

## 2017-08-10 MED ORDER — MORPHINE SULFATE (PF) 4 MG/ML IV SOLN
4.0000 mg | INTRAVENOUS | Status: AC | PRN
  Administered 2017-08-11 (×2): 4 mg via INTRAVENOUS
  Filled 2017-08-10 (×2): qty 1

## 2017-08-10 MED ORDER — ONDANSETRON HCL 4 MG PO TABS: 4.0000 mg | ORAL_TABLET | Freq: Four times a day (QID) | ORAL | Status: DC | PRN

## 2017-08-10 MED ORDER — POTASSIUM CHLORIDE IN NACL 20-0.9 MEQ/L-% IV SOLN
INTRAVENOUS | Status: AC
  Administered 2017-08-10: 21:00:00 via INTRAVENOUS
  Filled 2017-08-10: qty 1000

## 2017-08-10 MED ORDER — DEXTROSE 5 % IV SOLN
2.0000 g | Freq: Once | INTRAVENOUS | Status: AC
  Administered 2017-08-10: 2 g via INTRAVENOUS
  Filled 2017-08-10: qty 2

## 2017-08-10 MED ORDER — VANCOMYCIN HCL IN DEXTROSE 1-5 GM/200ML-% IV SOLN
1000.0000 mg | Freq: Once | INTRAVENOUS | Status: AC
Start: 2017-08-10 — End: 2017-08-10
  Administered 2017-08-10: 1000 mg via INTRAVENOUS
  Filled 2017-08-10: qty 200

## 2017-08-10 MED ORDER — ONDANSETRON HCL 4 MG/2ML IJ SOLN
4.0000 mg | Freq: Four times a day (QID) | INTRAMUSCULAR | Status: DC | PRN
  Administered 2017-08-12 – 2017-08-14 (×3): 4 mg via INTRAVENOUS
  Filled 2017-08-10 (×4): qty 2

## 2017-08-10 MED ORDER — MORPHINE SULFATE (PF) 4 MG/ML IV SOLN
4.0000 mg | Freq: Once | INTRAVENOUS | Status: AC
  Administered 2017-08-10: 4 mg via INTRAVENOUS
  Filled 2017-08-10: qty 1

## 2017-08-10 NOTE — Progress Notes (Signed)
A consult was received from an ED physician for Vancomycin and Zosyn per pharmacy dosing.  The patient's profile has been reviewed for ht/wt/allergies/indication/available labs. A one time order has been placed for the above antibiotics.  Further antibiotics/pharmacy consults should be ordered by admitting physician if indicated.                       Thank you, Bernadene Personrew Basil Blakesley, PharmD, BCPS Pager: [phone] 08/10/2017, 4:41 PM

## 2017-08-10 NOTE — ED Notes (Signed)
Asked pt if she would like purwick placed and if clean up assistance was needed, as pt states she is incontinent. Pt states she does not need to be cleaned up and refuses the purwick at this time.

## 2017-08-10 NOTE — ED Notes (Addendum)
First set of blood cultures blue top rejected by lab.

## 2017-08-10 NOTE — ED Notes (Signed)
Purple avatar just popped up for me to call report

## 2017-08-10 NOTE — ED Triage Notes (Signed)
Pt is paraplegic and has a bed sore with tunneling. Pt also stated pt has Osteomyelitis. Pt is AOx4.

## 2017-08-10 NOTE — Progress Notes (Signed)
Pharmacy Antibiotic Note  Kathryn Ewing is a 31 y.o. female admitted on 08/10/2017 with osteomyeltis.  Pharmacy has been consulted for cefepime and vancomycin dosing.  Plan: Cefepime 2 Gm IV q8h Vancomcyin 1 gm x1 then 750 mg IV q12h for est AUC = 515 Goal AUC=400-500 F/u scr/cultures/levels  Height: 5\' 4"  (162.6 cm) Weight: 126 lb 5.2 oz (57.3 kg) IBW/kg (Calculated) : 54.7  Temp (24hrs), Avg:98.7 F (37.1 C), Min:98.7 F (37.1 C), Max:98.7 F (37.1 C)  Recent Labs  Lab 08/10/17 1522 08/10/17 1624  WBC 12.7*  --   CREATININE <0.30*  --   LATICACIDVEN  --  0.73    CrCl cannot be calculated (This lab value cannot be used to calculate CrCl because it is not a number: <0.30).    Allergies  Allergen Reactions  . Tramadol Nausea And Vomiting, Rash and Other (See Comments)    Reaction:  Vision changes    . Amoxicillin Nausea And Vomiting and Other (See Comments)    Has patient had a PCN reaction causing immediate rash, facial/tongue/throat swelling, SOB or lightheadedness with hypotension: No Has patient had a PCN reaction causing severe rash involving mucus membranes or skin necrosis: No Has patient had a PCN reaction that required hospitalization No Has patient had a PCN reaction occurring within the last 10 years: Yes If all of the above answers are "NO", then may proceed with Cephalosporin use.  . Codeine Nausea And Vomiting  . Hydrocodone Nausea And Vomiting  . Meropenem Nausea And Vomiting  . Metronidazole Nausea And Vomiting  . Oxycodone-Acetaminophen Itching    Can tolerate morphine  . Piperacillin Sod-Tazobactam So Nausea And Vomiting  . Promethazine Nausea And Vomiting  . Diazepam Rash    Antimicrobials this admission: 12/15 cefepime >>  12/15 vancomycin >>   Dose adjustments this admission:   Microbiology results:  BCx:   UCx:    Sputum:    MRSA PCR:   Thank you for allowing pharmacy to be a part of this patient's care.  Lorenza EvangelistGreen, Tramaine Sauls  R 08/10/2017 10:00 PM

## 2017-08-10 NOTE — ED Notes (Signed)
Requested for RN Leta JunglingJake to preform an ultrasound IV, pt requested to have picc line accessed.

## 2017-08-10 NOTE — ED Notes (Signed)
ED TO INPATIENT HANDOFF REPORT  Name/Age/Gender Kathryn Ewing 31 y.o. female  Code Status Code Status History    Date Active Date Inactive Code Status Order ID Comments User Context   11/06/2016 20:59 11/09/2016 20:59 Full Code [id number]  Barton Dubois, MD Inpatient   09/04/2016 04:35 09/09/2016 17:35 Full Code [id number]  Rise Patience, MD ED   08/02/2016 18:07 08/05/2016 20:09 Full Code [id number]  Donne Hazel, MD ED      Home/SNF/Other Home  Chief Complaint pressure sore  Level of Care/Admitting Diagnosis ED Disposition    ED Disposition Condition [place] [facility] Area: [facility] [[id number]]  Level of Care: Med-Surg [16]  Diagnosis: Osteomyelitis of pelvis [facility]) [[id number]]  Admitting Physician: Reubin Milan [[id number]]  Attending Physician: Reubin Milan [[id number]]  Estimated length of stay: past midnight tomorrow  Certification:: I certify this patient will need inpatient services for at least 2 midnights  PT Class (Do Not Modify): Inpatient [101]  PT Acc Code (Do Not Modify): Private [1]       Medical History Past Medical History:  Diagnosis Date  . Amputee   . Amputee, below knee, left ([place])   . Amputee, below knee, right ([place])   . Anemia, iron deficiency 09/05/2016  . B12 deficiency 09/05/2016  . Osteomyelitis ([place])   . S/P flap graft     Allergies Allergies  Allergen Reactions  . Tramadol Nausea And Vomiting, Rash and Other (See Comments)    Reaction:  Vision changes    . Amoxicillin Nausea And Vomiting and Other (See Comments)    Has patient had a PCN reaction causing immediate rash, facial/tongue/throat swelling, SOB or lightheadedness with hypotension: No Has patient had a PCN reaction causing severe rash involving mucus membranes or skin necrosis: No Has patient had a PCN reaction that required hospitalization No Has patient had a PCN reaction occurring within the last 10 years: Yes If all of the above  answers are "NO", then may proceed with Cephalosporin use.  . Codeine Nausea And Vomiting  . Hydrocodone Nausea And Vomiting  . Meropenem Nausea And Vomiting  . Metronidazole Nausea And Vomiting  . Oxycodone-Acetaminophen Itching    Can tolerate morphine  . Piperacillin Sod-Tazobactam So Nausea And Vomiting  . Promethazine Nausea And Vomiting  . Diazepam Rash    IV Location/Drains/Wounds Patient Lines/Drains/Airways Status   Active Line/Drains/Airways    Name:   Placement date:   Placement time:   Site:   Days:   Peripheral IV 08/10/17 Left;Upper Arm   08/10/17    1750    Arm   less than 1   Tunneled CVC Double Lumen (Radiology) Right Subclavian   -    -        Urethral Catheter ANNIEAH, NT Double-lumen;Latex 14 Fr.   11/06/16    1637    Double-lumen;Latex   277   Pressure Injury 11/06/16 Stage IV - Full thickness tissue loss with exposed bone, tendon or muscle.   11/06/16    0000     277          Labs/Imaging Results for orders placed or performed during the hospital encounter of 08/10/17 (from the past 48 hour(s))  Comprehensive metabolic panel     Status: Abnormal   Collection Time: 08/10/17  3:22 PM  Result Value Ref Range   Sodium 140 135 - 145 mmol/L   Potassium 3.2 (L) 3.5 - 5.1 mmol/L   Chloride 109 101 -  111 mmol/L   CO2 21 (L) 22 - 32 mmol/L   Glucose, Bld 82 65 - 99 mg/dL   BUN 10 6 - 20 mg/dL   Creatinine, Ser <0.30 (L) 0.44 - 1.00 mg/dL   Calcium 8.8 (L) 8.9 - 10.3 mg/dL   Total Protein 7.9 6.5 - 8.1 g/dL   Albumin 4.0 3.5 - 5.0 g/dL   AST 13 (L) 15 - 41 U/L   ALT 5 (L) 14 - 54 U/L   Alkaline Phosphatase 60 38 - 126 U/L   Total Bilirubin 1.0 0.3 - 1.2 mg/dL   GFR calc non Af Amer NOT CALCULATED >60 mL/min   GFR calc Af Amer NOT CALCULATED >60 mL/min    Comment: (NOTE) The eGFR has been calculated using the CKD EPI equation. This calculation has not been validated in all clinical situations. eGFR's persistently <60 mL/min signify possible Chronic  Kidney Disease.    Anion gap 10 5 - 15  CBC WITH DIFFERENTIAL     Status: Abnormal   Collection Time: 08/10/17  3:22 PM  Result Value Ref Range   WBC 12.7 (H) 4.0 - 10.5 K/uL   RBC 4.50 3.87 - 5.11 MIL/uL   Hemoglobin 10.9 (L) 12.0 - 15.0 g/dL   HCT 34.4 (L) 36.0 - 46.0 %   MCV 76.4 (L) 78.0 - 100.0 fL   MCH 24.2 (L) 26.0 - 34.0 pg   MCHC 31.7 30.0 - 36.0 g/dL   RDW 18.3 (H) 11.5 - 15.5 %   Platelets 354 150 - 400 K/uL   Neutrophils Relative % 65 %   Neutro Abs 8.3 (H) 1.7 - 7.7 K/uL   Lymphocytes Relative 28 %   Lymphs Abs 3.5 0.7 - 4.0 K/uL   Monocytes Relative 6 %   Monocytes Absolute 0.8 0.1 - 1.0 K/uL   Eosinophils Relative 1 %   Eosinophils Absolute 0.1 0.0 - 0.7 K/uL   Basophils Relative 0 %   Basophils Absolute 0.0 0.0 - 0.1 K/uL  Sedimentation rate     Status: Abnormal   Collection Time: 08/10/17  3:22 PM  Result Value Ref Range   Sed Rate 50 (H) 0 - 22 mm/hr  I-Stat CG4 Lactic Acid, ED  (not at  [facility])     Status: None   Collection Time: 08/10/17  4:24 PM  Result Value Ref Range   Lactic Acid, Venous 0.73 0.5 - 1.9 mmol/L  Magnesium     Status: None   Collection Time: 08/10/17  6:00 PM  Result Value Ref Range   Magnesium 1.8 1.7 - 2.4 mg/dL   Dg Chest 2 View  Result Date: 08/10/2017 CLINICAL DATA:  Bed sores.  Back surgery.  Ex smoker. EXAM: CHEST  2 VIEW COMPARISON:  10/27/2016 FINDINGS: The heart size and mediastinal contours are within normal limits. Both lungs are clear. Central line catheter tip is seen in the distal SVC. Stable partially included lower thoracic and lumbar spinal fixation hardware. The visualized skeletal structures are unremarkable. IMPRESSION: No active cardiopulmonary disease. Electronically Signed   By: Ashley Royalty M.D.   On: 08/10/2017 18:11   Dg Pelvis 1-2 Views  Result Date: 08/10/2017 CLINICAL DATA:  Left gluteal bed sore. EXAM: PELVIS - 1-2 VIEW COMPARISON:  09/04/2016 MRI, CT 08/10/2016 FINDINGS: Re- demonstration of bone  destruction of the left inferior pubic ramus with residual sclerotic changes of the ischium and remaining left inferior pubic ramus consistent with chronic osteomyelitis. Soft tissue ulceration is seen adjacent to this. The  remainder of the study is unremarkable. IVC filter is seen in the right lower quadrant of the abdomen. Osteoarthritic joint space narrowing of both hips. Maintained proximal femora, right pubic rami and left superior pubic ramus. IMPRESSION: Re- demonstration of chronic osteomyelitis involving the left ischium and inferior pubic ramus adjacent to a known soft tissue ulceration of the gluteal soft tissues. No significant change. Electronically Signed   By: Ashley Royalty M.D.   On: 08/10/2017 18:15    Pending Labs Unresulted Labs (From admission, onward)   Start     Ordered   08/10/17 1523  C-reactive protein  Once,   STAT     08/10/17 1522   08/10/17 1522  Blood Culture (routine x 2)  BLOOD CULTURE X 2,   STAT     08/10/17 1522      Vitals/Pain Today's Vitals   08/10/17 1824 08/10/17 1850 08/10/17 1900 08/10/17 1930  BP: (!) 118/59  102/60 (!) 103/55  Pulse:   82 93  Resp: 15   (!) 21  SpO2:   100% 100%  Weight:      Height:      PainSc:  4       Isolation Precautions No active isolations  Medications Medications  0.9 % NaCl with KCl 20 mEq/ L  infusion (not administered)  ceFEPIme (MAXIPIME) 2 g in dextrose 5 % 50 mL IVPB (0 g Intravenous Stopped 08/10/17 1723)  vancomycin (VANCOCIN) IVPB 1000 mg/200 mL premix (1,000 mg Intravenous New Bag/Given 08/10/17 1830)  morphine 4 MG/ML injection 4 mg (4 mg Intravenous Given 08/10/17 1826)    Mobility non-ambulatory-personal wheelchair at bedside

## 2017-08-10 NOTE — H&P (Addendum)
History and Physical    Kathryn Ewing ZOX:[id number] DOB: 02/17/1986 DOA: 08/10/2017  PCP: Patient, No Pcp Per   Patient coming from: Home.  I have personally briefly reviewed [facility]  Chief Complaint: Bed sore with history of osteomyelitis.  HPI: Kathryn Ewing is a 31 y.o. 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  She also stated that she has been vomiting, but did not elaborate on this.  She did not answer how many times he had vomited.  She denies fever, chills, chest pain, dyspnea, palpitations, abdominal pain, diarrhea, melena or hematochezia.  She did not allow me to turn the lights on.  She declined examination of the wound area with a female chaperone. As I was reviewing her records, she was upset because there was a previous diagnosis of tachycardia ('that nobody explained to her before"),  and was very adversarial during interview/physical examination. She just kept saying that it was her osteomyelitis.  ED Course: Initial vital signs no temperature on record, pulse 110, respirations 18, blood pressure 106/68 mmHg and O2 sat 100% on room air.  She received a normal saline 1000 mL bolus, morphine 4 mg IVP, vancomycin and cefepime.  I added Zofran 4 mg IVP for nausea.  Her lactic acid was normal.  WBC 12.7 with 65% neutrophils, hemoglobin 10.9 g/dL and platelets [id number].  Her sed rate was 50 mm/h.  Her potassium was 3.2 and CO2 21 mmol/L.  BUN was 10, creatinine less than 0.3, magnesium 1.8 and calcium 8.8 mg/dL.  All other values on her CMP are unremarkable.  Imaging her chest radiograph did not show any active cardiopulmonary disease.  Pelvic  x-ray showed redemonstration of chronic osteomyelitis involving the left knee skin and inferior pubic ramus adjacent to unknown soft tissue ulceration of the gluteal soft tissues.  No significant radiographical change.  Please see images and full radiology report for further detail.  Review of Systems: Unable to fully obtain, as the patient did not want to fully participate in my assesment.    Past Medical History:  Diagnosis Date  . Amputee   . Amputee, below knee, left (HCC)   . Amputee, below knee, right (HCC)   . Anemia, iron deficiency 09/05/2016  . B12 deficiency 09/05/2016  . Osteomyelitis (HCC)   . S/P flap graft     Past Surgical History:  Procedure Laterality Date  . BACK SURGERY    . CESAREAN SECTION    . FREE FLAP GRAFT       reports that she has quit smoking. she has never used smokeless tobacco. She reports that she uses drugs. Drug: Marijuana. She reports that she does not drink alcohol.  Allergies  Allergen Reactions  . Tramadol Nausea And Vomiting, Rash and Other (See Comments)    Reaction:  Vision changes    . Amoxicillin Nausea And Vomiting and Other (See Comments)    Has patient had a PCN reaction causing immediate rash, facial/tongue/throat swelling, SOB or lightheadedness with hypotension: No Has patient had a PCN reaction causing severe rash involving mucus membranes or skin necrosis: No Has patient had a PCN reaction that required hospitalization No Has patient had a PCN reaction occurring within the last 10  years: Yes If all of the above answers are "NO", then may proceed with Cephalosporin use.  . Codeine Nausea And Vomiting  . Hydrocodone Nausea And Vomiting  . Meropenem Nausea And Vomiting  . Metronidazole Nausea And Vomiting  . Oxycodone-Acetaminophen Itching    Can tolerate morphine  . Piperacillin Sod-Tazobactam So Nausea And Vomiting  . Promethazine Nausea And Vomiting  . Diazepam Rash    Family History  Problem Relation Age of Onset  .  Diabetes Mellitus II Other   . Diabetes Mellitus II Mother   . Heart disease Mother     Prior to Admission medications   Not on File    Physical Exam: Vitals:   08/10/17 1632 08/10/17 1824 08/10/17 1900 08/10/17 1930  BP: 109/66 (!) 118/59 102/60 (!) 103/55  Pulse: 84  82 93  Resp: 14 15  (!) 21  SpO2: 100%  100% 100%  Weight:      Height:        Constitutional: NAD, calm, comfortable Eyes: PERRL, lids and conjunctivae normal ENMT: Mucous membranes are moist. Posterior pharynx clear of any exudate or lesions. Neck: normal, supple, no masses, no thyromegaly Respiratory: clear to auscultation bilaterally, no wheezing, no crackles. Normal respiratory effort. No accessory muscle use.  Cardiovascular: Regular rate and rhythm, no murmurs / rubs / gallops. No extremity edema. 2+ pedal pulses. No carotid bruits.  Abdomen: Soft, no tenderness, no masses palpated. No hepatosplenomegaly. Bowel sounds positive.  Musculoskeletal: no clubbing / cyanosis.  Bilateral BKA.  Normal muscle tone.  Skin: The patient refused to allow me to evaluate sacral ulcer.  See Dr. Rhunette CroftNanavati cyst creation next: Large pressure ulcer over the left hip region. There is follow order around the wound.  No active drainage.  No significant erythema or fluctuance.  Neurologic: CN 2-12 grossly intact.  Paraplegia at the lower thoracic level. Psychiatric:  Alert and oriented x 3.  Irritable mood.    Labs on Admission: I have personally reviewed following labs and imaging studies  CBC: Recent Labs  Lab 08/10/17 1522  WBC 12.7*  NEUTROABS 8.3*  HGB 10.9*  HCT 34.4*  MCV 76.4*  PLT 354   Basic Metabolic Panel: Recent Labs  Lab 08/10/17 1522 08/10/17 1800  NA 140  --   K 3.2*  --   CL 109  --   CO2 21*  --   GLUCOSE 82  --   BUN 10  --   CREATININE <0.30*  --   CALCIUM 8.8*  --   MG  --  1.8   GFR: CrCl cannot be calculated (This lab value cannot be used to calculate CrCl because it is not a number:  <0.30). Liver Function Tests: Recent Labs  Lab 08/10/17 1522  AST 13*  ALT 5*  ALKPHOS 60  BILITOT 1.0  PROT 7.9  ALBUMIN 4.0   No results for input(s): LIPASE, AMYLASE in the last 168 hours. No results for input(s): AMMONIA in the last 168 hours. Coagulation Profile: No results for input(s): INR, PROTIME in the last 168 hours. Cardiac Enzymes: No results for input(s): CKTOTAL, CKMB, CKMBINDEX, TROPONINI in the last 168 hours. BNP (last 3 results) No results for input(s): PROBNP in the last 8760 hours. HbA1C: No results for input(s): HGBA1C in the last 72 hours. CBG: No results for input(s): GLUCAP in the last 168 hours. Lipid Profile: No results for input(s): CHOL, HDL, LDLCALC, TRIG, CHOLHDL, LDLDIRECT in the last 72 hours. Thyroid Function Tests: No results for input(s):  TSH, T4TOTAL, FREET4, T3FREE, THYROIDAB in the last 72 hours. Anemia Panel: No results for input(s): VITAMINB12, FOLATE, FERRITIN, TIBC, IRON, RETICCTPCT in the last 72 hours. Urine analysis:    Component Value Date/Time   COLORURINE YELLOW 11/06/2016 1300   APPEARANCEUR HAZY (A) 11/06/2016 1300   LABSPEC 1.014 11/06/2016 1300   PHURINE 8.0 11/06/2016 1300   GLUCOSEU NEGATIVE 11/06/2016 1300   HGBUR NEGATIVE 11/06/2016 1300   BILIRUBINUR NEGATIVE 11/06/2016 1300   KETONESUR 80 (A) 11/06/2016 1300   PROTEINUR 30 (A) 11/06/2016 1300   NITRITE NEGATIVE 11/06/2016 1300   LEUKOCYTESUR MODERATE (A) 11/06/2016 1300    Radiological Exams on Admission: Dg Chest 2 View  Result Date: 08/10/2017 CLINICAL DATA:  Bed sores.  Back surgery.  Ex smoker. EXAM: CHEST  2 VIEW COMPARISON:  10/27/2016 FINDINGS: The heart size and mediastinal contours are within normal limits. Both lungs are clear. Central line catheter tip is seen in the distal SVC. Stable partially included lower thoracic and lumbar spinal fixation hardware. The visualized skeletal structures are unremarkable. IMPRESSION: No active cardiopulmonary  disease. Electronically Signed   By: Tollie Eth M.D.   On: 08/10/2017 18:11   Dg Pelvis 1-2 Views  Result Date: 08/10/2017 CLINICAL DATA:  Left gluteal bed sore. EXAM: PELVIS - 1-2 VIEW COMPARISON:  09/04/2016 MRI, CT 08/10/2016 FINDINGS: Re- demonstration of bone destruction of the left inferior pubic ramus with residual sclerotic changes of the ischium and remaining left inferior pubic ramus consistent with chronic osteomyelitis. Soft tissue ulceration is seen adjacent to this. The remainder of the study is unremarkable. IVC filter is seen in the right lower quadrant of the abdomen. Osteoarthritic joint space narrowing of both hips. Maintained proximal femora, right pubic rami and left superior pubic ramus. IMPRESSION: Re- demonstration of chronic osteomyelitis involving the left ischium and inferior pubic ramus adjacent to a known soft tissue ulceration of the gluteal soft tissues. No significant change. Electronically Signed   By: Tollie Eth M.D.   On: 08/10/2017 18:15    EKG: Independently reviewed.   Assessment/Plan Principal Problem:   Osteomyelitis of pelvis (HCC) Exacerbation of chronic osteomyelitis of left hip/inferior pubis ramus. Admit to MedSurg/inpatient. Continue IV fluids. Antiemetics as needed. Continue cefepime per pharmacy. Continue vancomycin per pharmacy. Follow-up blood cultures and sensitivity. The patient is already aware that most likely she will need outpatient antibiotics.  Active Problems:   Decubitus ulcer of ischial area, left, stage IV (HCC) Unfortunately, the patient did not allow me to evaluate with chaperone. Please try to evaluate in the morning.  Consult general surgery if needed.    B12 deficiency Check B12 level. Supplement as needed.    Anemia of infection and chronic disease Anemia profile earlier this year showed low iron and low B12 level. Monitor hematocrit and hemoglobin.    Hypokalemia Replacing. Follow-up potassium level.   DVT  prophylaxis: Lovenox SQ. Code Status: Full code. Family Communication: None at bedside. Disposition Plan: Admit for IV antibiotics for several days. Consults called:  Admission status: Inpatient/MedSurg.   Bobette Mo MD [facility] Pager [phone].  If 7PM-7AM, please contact night-coverage [email or web address] Password [email or web address]  08/10/2017, 8:45 PM

## 2017-08-10 NOTE — ED Provider Notes (Signed)
[facility]-EMERGENCY DEPT Provider Note   CSN: [id number] Arrival date & time: 08/10/17  1321     History   Chief Complaint Chief Complaint  Patient presents with  . Wound Dehiscence    Left lower buttocks    HPI Kathryn Ewing is a 31 y.o. female.  HPI  Patient with history of chronic osteomyelitis, paraplegia, large pressure ulcers comes in with chief complaint of back pain.  Patient reports that she has history of osteomyelitis of her sacrum secondary to pressure ulcer.  Patient has been managing the wound at home herself.  Patient reports that over the past week she has noted increase drainage that is foul-smelling from the site of pressure ulcer.  Patient also has been having new pain in the back, along with chills, sweats.  Patient had similar symptoms when she had her osteomyelitis.  Patient continues to have a PICC line from last year. Records indicate that patient was getting IV antibiotics for her osteomyelitis, and at some point she refused further antibiotics.  It seems like the PICC never was removed, and patient states that she never got it removed because she was concerned that she will need PICC again if her infection returned.  Past Medical History:  Diagnosis Date  . Amputee   . Amputee, below knee, left (HCC)   . Amputee, below knee, right (HCC)   . Anemia, iron deficiency 09/05/2016  . B12 deficiency 09/05/2016  . Osteomyelitis (HCC)   . S/P flap graft     Patient Active Problem List   Diagnosis Date Noted  . Sepsis (HCC) 11/06/2016  . Pseudomonas infection   . Chronic osteomyelitis (HCC)   . Urinary tract infection without hematuria   . B12 deficiency 09/05/2016  . Anemia, iron deficiency 09/05/2016  . Decubitus ulcer of ischial area, left, stage IV (HCC) 09/04/2016  . Chronic osteomyelitis of hip (HCC): Subacute on chronic left ischial osteomyelitis 08/02/2016  . Tachycardia 08/02/2016  . Lower urinary tract infectious disease  08/02/2016  . Pressure injury of skin 08/02/2016  . S/P bilateral BKA (below knee amputation) (HCC)   . Amputee, below knee, right (HCC)   . Neurogenic bladder 05/10/2016  . History of osteomyelitis 04/17/2016  . 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 Depression 11/02/2015  . Anxiety 11/02/2015  . Chronic pain due to trauma 09/29/2014  . Decreased mobility 07/20/2014  . MSSA (methicillin susceptible Staphylococcus aureus) infection 07/16/2014  . Anemia of infection and chronic disease 10/22/2012    Past Surgical History:  Procedure Laterality Date  . BACK SURGERY    . CESAREAN SECTION    . FREE FLAP GRAFT      OB History    No data available       Home Medications    Prior to Admission medications   Not on File    Family History Family History  Problem Relation Age of Onset  . Diabetes Mellitus II Other     Social History Social History   Tobacco Use  . Smoking status: Former Games developer  . Smokeless tobacco: Never Used  Substance Use Topics  . Alcohol use: No  . Drug use: Yes    Types: Marijuana    Comment: sometimes      Allergies   Tramadol; Amoxicillin; Codeine; Hydrocodone; Meropenem; Metronidazole; Oxycodone-acetaminophen; Piperacillin sod-tazobactam so; Promethazine; and Diazepam   Review of Systems Review of Systems  Constitutional: Positive for activity change, chills and diaphoresis. Negative for fever.  Gastrointestinal: Positive for  nausea.  Musculoskeletal: Positive for back pain.  Allergic/Immunologic: Negative for immunocompromised state.  All other systems reviewed and are negative.    Physical Exam Updated Vital Signs BP (!) 118/59   Pulse 84   Resp 15   Ht 5\' 4"  (1.626 m)   Wt 63.5 kg (140 lb)   SpO2 100%   BMI 24.03 kg/m   Physical Exam  Constitutional: She is oriented to person, place, and time. She appears well-developed.  HENT:  Head: Normocephalic and atraumatic.  Eyes: EOM are normal.  Neck: Normal range of  motion. Neck supple.  Cardiovascular: Normal rate.  Pulmonary/Chest: Effort normal.  Abdominal: Bowel sounds are normal.  Musculoskeletal:  Large pressure ulcer over the left hip region. There is follow order around the wound.  No active drainage.  No significant erythema or fluctuance.  Neurological: She is alert and oriented to person, place, and time.  Skin: Skin is warm and dry.  Nursing note and vitals reviewed.    ED Treatments / Results  Labs (all labs ordered are listed, but only abnormal results are displayed) Labs Reviewed  COMPREHENSIVE METABOLIC PANEL - Abnormal; Notable for the following components:      Result Value   Potassium 3.2 (*)    CO2 21 (*)    Creatinine, Ser <0.30 (*)    Calcium 8.8 (*)    AST 13 (*)    ALT 5 (*)    All other components within normal limits  CBC WITH DIFFERENTIAL/PLATELET - Abnormal; Notable for the following components:   WBC 12.7 (*)    Hemoglobin 10.9 (*)    HCT 34.4 (*)    MCV 76.4 (*)    MCH 24.2 (*)    RDW 18.3 (*)    Neutro Abs 8.3 (*)    All other components within normal limits  SEDIMENTATION RATE - Abnormal; Notable for the following components:   Sed Rate 50 (*)    All other components within normal limits  CULTURE, BLOOD (ROUTINE X 2)  CULTURE, BLOOD (ROUTINE X 2)  C-REACTIVE PROTEIN  I-STAT CG4 LACTIC ACID, ED  I-STAT BETA HCG BLOOD, ED ([facility], [facility], [facility] ONLY)    EKG  EKG Interpretation None       Radiology Dg Chest 2 View  Result Date: 08/10/2017 CLINICAL DATA:  Bed sores.  Back surgery.  Ex smoker. EXAM: CHEST  2 VIEW COMPARISON:  10/27/2016 FINDINGS: The heart size and mediastinal contours are within normal limits. Both lungs are clear. Central line catheter tip is seen in the distal SVC. Stable partially included lower thoracic and lumbar spinal fixation hardware. The visualized skeletal structures are unremarkable. IMPRESSION: No active cardiopulmonary disease. Electronically Signed   By: Tollie Ethavid  Kwon M.D.    On: 08/10/2017 18:11   Dg Pelvis 1-2 Views  Result Date: 08/10/2017 CLINICAL DATA:  Left gluteal bed sore. EXAM: PELVIS - 1-2 VIEW COMPARISON:  09/04/2016 MRI, CT 08/10/2016 FINDINGS: Re- demonstration of bone destruction of the left inferior pubic ramus with residual sclerotic changes of the ischium and remaining left inferior pubic ramus consistent with chronic osteomyelitis. Soft tissue ulceration is seen adjacent to this. The remainder of the study is unremarkable. IVC filter is seen in the right lower quadrant of the abdomen. Osteoarthritic joint space narrowing of both hips. Maintained proximal femora, right pubic rami and left superior pubic ramus. IMPRESSION: Re- demonstration of chronic osteomyelitis involving the left ischium and inferior pubic ramus adjacent to a known soft tissue ulceration of the gluteal  soft tissues. No significant change. Electronically Signed   By: Tollie Ethavid  Kwon M.D.   On: 08/10/2017 18:15    Procedures Procedures (including critical care time)  Medications Ordered in ED Medications  vancomycin (VANCOCIN) IVPB 1000 mg/200 mL premix (1,000 mg Intravenous New Bag/Given 08/10/17 1830)  ceFEPIme (MAXIPIME) 2 g in dextrose 5 % 50 mL IVPB (0 g Intravenous Stopped 08/10/17 1723)  morphine 4 MG/ML injection 4 mg (4 mg Intravenous Given 08/10/17 1826)     Initial Impression / Assessment and Plan / ED Course  I have reviewed the triage vital signs and the nursing notes.  Pertinent labs & imaging results that were available during my care of the patient were reviewed by me and considered in my medical decision making (see chart for details).     Patient comes in with chief complaint of worsening back pain along with nausea, chills, diaphoresis.  Patient has history of osteomyelitis and reports that her symptoms are similar to her osteomyelitis. Appropriate imaging has been ordered.  Patient still has her PICC from last year in place.  It does not appear that the site  surrounding the PICC area is infected, however pick itself could be a source of infection if her blood cultures come back positive.  Broad-spectrum antibiotic started based on patient's last susceptibilities.  Final Clinical Impressions(s) / ED Diagnoses   Final diagnoses:  Wound dehiscence  Chronic osteoarthritis    ED Discharge Orders    None       Derwood KaplanNanavati, Aldon Hengst, MD 08/10/17 1840

## 2017-08-11 DIAGNOSIS — Z881 Allergy status to other antibiotic agents status: Secondary | ICD-10-CM

## 2017-08-11 DIAGNOSIS — M869 Osteomyelitis, unspecified: Secondary | ICD-10-CM

## 2017-08-11 DIAGNOSIS — Z888 Allergy status to other drugs, medicaments and biological substances status: Secondary | ICD-10-CM

## 2017-08-11 DIAGNOSIS — Z95818 Presence of other cardiac implants and grafts: Secondary | ICD-10-CM

## 2017-08-11 DIAGNOSIS — Z833 Family history of diabetes mellitus: Secondary | ICD-10-CM

## 2017-08-11 DIAGNOSIS — Z89511 Acquired absence of right leg below knee: Secondary | ICD-10-CM

## 2017-08-11 DIAGNOSIS — Z87891 Personal history of nicotine dependence: Secondary | ICD-10-CM

## 2017-08-11 DIAGNOSIS — Z89512 Acquired absence of left leg below knee: Secondary | ICD-10-CM

## 2017-08-11 DIAGNOSIS — F329 Major depressive disorder, single episode, unspecified: Secondary | ICD-10-CM

## 2017-08-11 DIAGNOSIS — F419 Anxiety disorder, unspecified: Secondary | ICD-10-CM

## 2017-08-11 DIAGNOSIS — R112 Nausea with vomiting, unspecified: Secondary | ICD-10-CM

## 2017-08-11 DIAGNOSIS — L89329 Pressure ulcer of left buttock, unspecified stage: Secondary | ICD-10-CM

## 2017-08-11 DIAGNOSIS — Z880 Allergy status to penicillin: Secondary | ICD-10-CM

## 2017-08-11 DIAGNOSIS — R32 Unspecified urinary incontinence: Secondary | ICD-10-CM

## 2017-08-11 DIAGNOSIS — Z6821 Body mass index (BMI) 21.0-21.9, adult: Secondary | ICD-10-CM

## 2017-08-11 DIAGNOSIS — G8220 Paraplegia, unspecified: Secondary | ICD-10-CM

## 2017-08-11 DIAGNOSIS — Z885 Allergy status to narcotic agent status: Secondary | ICD-10-CM

## 2017-08-11 DIAGNOSIS — Z8249 Family history of ischemic heart disease and other diseases of the circulatory system: Secondary | ICD-10-CM

## 2017-08-11 DIAGNOSIS — R634 Abnormal weight loss: Secondary | ICD-10-CM

## 2017-08-11 LAB — CBC WITH DIFFERENTIAL/PLATELET
BASOS PCT: 0 %
Basophils Absolute: 0 10*3/uL (ref 0.0–0.1)
Eosinophils Absolute: 0.2 10*3/uL (ref 0.0–0.7)
Eosinophils Relative: 3 %
HEMATOCRIT: 30.2 % — AB (ref 36.0–46.0)
HEMOGLOBIN: 9.4 g/dL — AB (ref 12.0–15.0)
LYMPHS ABS: 2.7 10*3/uL (ref 0.7–4.0)
Lymphocytes Relative: 36 %
MCH: 24.3 pg — ABNORMAL LOW (ref 26.0–34.0)
MCHC: 31.1 g/dL (ref 30.0–36.0)
MCV: 78 fL (ref 78.0–100.0)
MONOS PCT: 8 %
Monocytes Absolute: 0.6 10*3/uL (ref 0.1–1.0)
NEUTROS ABS: 4 10*3/uL (ref 1.7–7.7)
NEUTROS PCT: 53 %
Platelets: 263 10*3/uL (ref 150–400)
RBC: 3.87 MIL/uL (ref 3.87–5.11)
RDW: 18.7 % — ABNORMAL HIGH (ref 11.5–15.5)
WBC: 7.5 10*3/uL (ref 4.0–10.5)

## 2017-08-11 LAB — BASIC METABOLIC PANEL
Anion gap: 7 (ref 5–15)
BUN: 7 mg/dL (ref 6–20)
CHLORIDE: 112 mmol/L — AB (ref 101–111)
CO2: 20 mmol/L — AB (ref 22–32)
Calcium: 7.7 mg/dL — ABNORMAL LOW (ref 8.9–10.3)
Glucose, Bld: 86 mg/dL (ref 65–99)
POTASSIUM: 3.5 mmol/L (ref 3.5–5.1)
Sodium: 139 mmol/L (ref 135–145)

## 2017-08-11 LAB — MRSA PCR SCREENING: MRSA BY PCR: NEGATIVE

## 2017-08-11 LAB — C-REACTIVE PROTEIN: CRP: 4.7 mg/dL — AB (ref <1.0)

## 2017-08-11 MED ORDER — POLYETHYLENE GLYCOL 3350 17 G PO PACK
17.0000 g | PACK | Freq: Every day | ORAL | Status: DC
  Administered 2017-08-11 – 2017-08-14 (×4): 17 g via ORAL
  Filled 2017-08-11 (×4): qty 1

## 2017-08-11 MED ORDER — MORPHINE SULFATE (PF) 2 MG/ML IV SOLN
2.0000 mg | INTRAVENOUS | Status: DC | PRN
  Administered 2017-08-11 – 2017-08-14 (×9): 2 mg via INTRAVENOUS
  Filled 2017-08-11 (×9): qty 1

## 2017-08-11 MED ORDER — ACETAMINOPHEN 325 MG PO TABS
650.0000 mg | ORAL_TABLET | Freq: Four times a day (QID) | ORAL | Status: DC | PRN
  Administered 2017-08-14: 650 mg via ORAL
  Filled 2017-08-11: qty 2

## 2017-08-11 MED ORDER — VANCOMYCIN HCL IN DEXTROSE 1-5 GM/200ML-% IV SOLN
1000.0000 mg | Freq: Two times a day (BID) | INTRAVENOUS | Status: DC
  Administered 2017-08-11 – 2017-08-12 (×3): 1000 mg via INTRAVENOUS
  Filled 2017-08-11 (×3): qty 200

## 2017-08-11 MED ORDER — SODIUM CHLORIDE 0.9% FLUSH: 10.0000 mL | INTRAVENOUS | Status: DC | PRN

## 2017-08-11 MED ORDER — MORPHINE SULFATE 15 MG PO TABS
15.0000 mg | ORAL_TABLET | ORAL | Status: DC | PRN
  Administered 2017-08-11 – 2017-08-14 (×5): 15 mg via ORAL
  Filled 2017-08-11 (×5): qty 1

## 2017-08-11 MED ORDER — KETOROLAC TROMETHAMINE 15 MG/ML IJ SOLN
15.0000 mg | Freq: Four times a day (QID) | INTRAMUSCULAR | Status: DC
  Administered 2017-08-11 – 2017-08-14 (×11): 15 mg via INTRAVENOUS
  Filled 2017-08-11 (×12): qty 1

## 2017-08-11 NOTE — Progress Notes (Signed)
Dr Allena KatzPatel in to speak with pt and he ordered a consult with infectious disease. Dr. Orvan Falconerampbell from ID in to assess pt's wound and speak with pt. He explained he was going to put in a consult to her Urology MD. There is already a consult in for the wound nurse. Dr. Orvan Falconerampbell explained after all the consults are done the team would come up with a long term plan for her to help with wound care,nutrition,help getting supplies for the wound and what ever they see she needs help with to help to heal her wound.

## 2017-08-11 NOTE — Consult Note (Signed)
[facility]  Reason for Consult: Chronic left ischial ulcer Referring Physician: Dr. Allena Katz  Assessment: She has a very chronic left ischial ulcer and changes on plain x-ray compatible with previous left pubic ramus osteomyelitis. Her history suggests possible new wound infection but that is not certain by my exam today. Feels okay to continue current empiric antibiotic and order a wound care consult. It will be very important to try to address her social issues and see if it is possible to get her more resources at home. Notes indicate that she has refused care in the past. I would also encourage assessment of her anxiety and depression.   Plan: 1. Continue current antibiotics 2. Wound care consult 3. Consider urology consult again   Patient Active Problem List   Diagnosis Date Noted  . Osteomyelitis of pelvis (HCC) 08/10/2017    Priority: High  . Decubitus ulcer of ischial area, left, stage IV (HCC) 09/04/2016    Priority: High  . Hypokalemia 08/10/2017  . B12 deficiency 09/05/2016  . Anemia, iron deficiency 09/05/2016  . Chronic osteomyelitis of hip (HCC): Subacute on chronic left ischial osteomyelitis 08/02/2016  . Tachycardia 08/02/2016  . Lower urinary tract infectious disease 08/02/2016  . S/P bilateral BKA (below knee amputation) (HCC)   . Neurogenic bladder 05/10/2016  . 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 Depression 11/02/2015  . Anxiety 11/02/2015  . Chronic pain due to trauma 09/29/2014  . Anemia of infection and chronic disease 10/22/2012      Medication List    You have not been prescribed any medications.     HPI: Kathryn Ewing is a 31 y.o. female with a history of T9 paraplegia 11 years ago. She is status post bilateral BKA. She underwent muscle flap repair of a right ischial ulcer a year and a half ago at [facility]. While recuperating from that surgery she developed a left ischial ulcer and osteomyelitis. She was  treated with 8 weeks of IV antibiotics late last year. Toward the end of that course of therapy she moved to [place]. She has 3 previous hospitalizations here in the past year for her ulcer and possible acute on chronic left pubic ramus osteomyelitis. She has received multiple courses of IV antibiotics for this, the last in March of this year. She has been noted on 2 occasions to be refusing care by advanced home care after these hospitalizations. Following her last hospitalization she refused to have her central line removed. The central line has been in place for about a year and a half. She was reluctant to have it removed because she felt like she would probably be rehospitalized and need antibiotics again. She has no primary care provider. She lives hearing [place] with her husband. She has been trying to care for her wound by herself. She has no family nearby. She has problems affording wound care supplies. She self catheterizes every 4 hours while awake but is frequently incontinent of urine which soils her wound. She says that during one recent hospitalization she was told that she would need urinary diversion but refused this. She says that she wants a "second opinion". She denies having any fecal soiling of her wound. She admits to being anxious and depressed. She states that her wound drainage increased recently and had some foul odor. She developed some nausea and vomiting leading to this hospitalization. She has not had any recent fever, chills or sweats.  Review  of Systems: Review of Systems  Constitutional: Positive for weight loss. Negative for chills, diaphoresis, fever and malaise/fatigue.       She has had a decrease in her appetite recently and thinks she may have lost a few pounds.  HENT: Negative for sore throat.   Respiratory: Negative for cough, sputum production and shortness of breath.   Cardiovascular: Negative for chest pain.  Gastrointestinal: Positive for nausea and  vomiting. Negative for abdominal pain, diarrhea and heartburn.  Musculoskeletal: Negative for myalgias.       She states that recently she has had pain deep in her pelvis which is distinctly unusual for her because she is paralyzed.  Skin: Negative for rash.  Neurological: Negative for dizziness and headaches.  Psychiatric/Behavioral: Positive for depression. Negative for substance abuse. The patient is nervous/anxious.     Marland Kitchen. enoxaparin (LOVENOX) injection  40 mg Subcutaneous Q24H  . feeding supplement (ENSURE ENLIVE)  237 mL Oral BID BM  . ketorolac  15 mg Intravenous Q6H  . polyethylene glycol  17 g Oral Daily    Past Medical History:  Diagnosis Date  . Amputee   . Amputee, below knee, left (HCC)   . Amputee, below knee, right (HCC)   . Anemia, iron deficiency 09/05/2016  . B12 deficiency 09/05/2016  . Osteomyelitis (HCC)   . S/P flap graft     Social History   Tobacco Use  . Smoking status: Former Games developermoker  . Smokeless tobacco: Never Used  Substance Use Topics  . Alcohol use: No  . Drug use: Yes    Types: Marijuana    Comment: sometimes     Family History  Problem Relation Age of Onset  . Diabetes Mellitus II Other   . Diabetes Mellitus II Mother   . Heart disease Mother    Allergies  Allergen Reactions  . Tramadol Nausea And Vomiting, Rash and Other (See Comments)    Reaction:  Vision changes    . Amoxicillin Nausea And Vomiting and Other (See Comments)    Has patient had a PCN reaction causing immediate rash, facial/tongue/throat swelling, SOB or lightheadedness with hypotension: No Has patient had a PCN reaction causing severe rash involving mucus membranes or skin necrosis: No Has patient had a PCN reaction that required hospitalization No Has patient had a PCN reaction occurring within the last 10 years: Yes If all of the above answers are "NO", then may proceed with Cephalosporin use.  . Codeine Nausea And Vomiting  . Hydrocodone Nausea And Vomiting  .  Meropenem Nausea And Vomiting  . Metronidazole Nausea And Vomiting  . Oxycodone-Acetaminophen Itching    Can tolerate morphine  . Piperacillin Sod-Tazobactam So Nausea And Vomiting  . Promethazine Nausea And Vomiting  . Diazepam Rash    OBJECTIVE: Vitals:   08/10/17 1900 08/10/17 1930 08/10/17 2137 08/11/17 0459  BP: 102/60 (!) 103/55 110/69 (!) 102/42  Pulse: 82 93 (!) 104 76  Resp:  (!) 21 20 18   Temp:   98.7 F (37.1 C) 98.1 F (36.7 C)  TempSrc:   Oral Oral  SpO2: 100% 100% 100% 100%  Weight:   126 lb 5.2 oz (57.3 kg)   Height:       Body mass index is 21.68 kg/m.   Physical Exam  Constitutional: She is oriented to person, place, and time.  She is very pleasant. She became somewhat tearful when talking about the struggle she has been having at home.  Cardiovascular: Normal rate and regular rhythm.  No murmur heard. Pulmonary/Chest: Effort normal. She has no rales.  Musculoskeletal:  She has a golf ball sized ulcer over her left ischium. There is no exposed bone. The tissue is pink. I do not detect any malodor. There is no visible drainage currently.  Neurological: She is alert and oriented to person, place, and time.  Skin: No rash noted.  Psychiatric: Mood and affect normal.    Microbiology: Recent Results (from the past 240 hour(s))  MRSA PCR Screening     Status: None   Collection Time: 08/10/17  9:53 PM  Result Value Ref Range Status   MRSA by PCR NEGATIVE NEGATIVE Final    Comment:        The GeneXpert MRSA Assay (FDA approved for NASAL specimens only), is one component of a comprehensive MRSA colonization surveillance program. It is not intended to diagnose MRSA infection nor to guide or monitor treatment for MRSA infections.     Cliffton AstersJohn Masin Shatto, MD [facility] [phone] pager   [phone] cell 08/11/2017, 12:19 PM

## 2017-08-11 NOTE — Consult Note (Signed)
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 Complex medical histrory complicated by social and adherence issues. Patient is well known to our department for the past 2 years; she was last seen by me nearly one year ago, in January of 2018.  Osteomyelitis. ID has been consulted. See note by Dr. Orvan Falconerampbell dated earlier today. Wound type:Pressure, infectious Pressure Injury POA: Yes Measurement: 3cm x 4cm x 3cm with undermining from 6-12 o'clock measuring 2cm and a tunnel at 12 o'clock measuring 5.5cm. Wound bed:red, moist, non-granulating (smooth) Drainage (amount, consistency, odor) light yellow, moderate amount, no odor Periwound: intact with evidence of previous wound healing, scarring.  Closed wound edges (epibole). Dressing procedure/placement/frequency: I will implement a daily dressing change using silver hydrofiber for absorption of drainage and donation of topical silver.  Patient to be placed on a mattress replacement with low air loss feature. Dressing changes will have to occur when soiled with urine or stool. Patient is adamant about needing indwelling urinary catheter while hospitalized. Noted are the recurring conversations regarding urine management. Case Management has not yet been consulted for this patient, if you agree, please order, engage their services.  WOC nursing team will not follow routinely, but will remain available to this patient, the nursing and medical teams.  Please re-consult if needed. Thanks, Kathryn MowLaurie Daisy Lites, MSN, RN, GNP, Hans EdenCWOCN, CWON-AP, FAAN  Pager# [phone]

## 2017-08-11 NOTE — Progress Notes (Signed)
Pharmacy Antibiotic Note  Kathryn Ewing is a 31 y.o. female admitted on 08/10/2017 with osteomyeltis.  Pharmacy has been consulted for cefepime and vancomycin dosing.  Plan:  Cefepime 2 Gm IV q8h - for h/o pseudomonas  Vancomcyin 1 gm q12h - adjust dose based off estimated pharmacokinetics using trough from March 2018  F/u scr/cultures/levels  Height: 5\' 4"  (162.6 cm) Weight: 126 lb 5.2 oz (57.3 kg) IBW/kg (Calculated) : 54.7  Temp (24hrs), Avg:98.4 F (36.9 C), Min:98.1 F (36.7 C), Max:98.7 F (37.1 C)  Recent Labs  Lab 08/10/17 1522 08/10/17 1624 08/11/17 0441  WBC 12.7*  --  7.5  CREATININE <0.30*  --  <0.30*  LATICACIDVEN  --  0.73  --     CrCl cannot be calculated (This lab value cannot be used to calculate CrCl because it is not a number: <0.30).    Allergies  Allergen Reactions  . Tramadol Nausea And Vomiting, Rash and Other (See Comments)    Reaction:  Vision changes    . Amoxicillin Nausea And Vomiting and Other (See Comments)    Has patient had a PCN reaction causing immediate rash, facial/tongue/throat swelling, SOB or lightheadedness with hypotension: No Has patient had a PCN reaction causing severe rash involving mucus membranes or skin necrosis: No Has patient had a PCN reaction that required hospitalization No Has patient had a PCN reaction occurring within the last 10 years: Yes If all of the above answers are "NO", then may proceed with Cephalosporin use.  . Codeine Nausea And Vomiting  . Hydrocodone Nausea And Vomiting  . Meropenem Nausea And Vomiting  . Metronidazole Nausea And Vomiting  . Oxycodone-Acetaminophen Itching    Can tolerate morphine  . Piperacillin Sod-Tazobactam So Nausea And Vomiting  . Promethazine Nausea And Vomiting  . Diazepam Rash    Antimicrobials this admission: 12/15 cefepime >>  12/15 vancomycin >>   Dose adjustments this admission:   Microbiology results:  BCx:   UCx:    Sputum:    MRSA PCR:   Thank you for  allowing pharmacy to be a part of this patient's care.  Juliette Alcideustin Zeigler, PharmD, BCPS.   Pager: [phone] 08/11/2017 1:49 PM
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
## 2017-08-12 DIAGNOSIS — L89159 Pressure ulcer of sacral region, unspecified stage: Secondary | ICD-10-CM

## 2017-08-12 DIAGNOSIS — Z9689 Presence of other specified functional implants: Secondary | ICD-10-CM

## 2017-08-12 LAB — CBC WITH DIFFERENTIAL/PLATELET
BASOS ABS: 0 10*3/uL (ref 0.0–0.1)
Basophils Relative: 0 %
Eosinophils Absolute: 0.2 10*3/uL (ref 0.0–0.7)
Eosinophils Relative: 4 %
HEMATOCRIT: 29.9 % — AB (ref 36.0–46.0)
Hemoglobin: 9.2 g/dL — ABNORMAL LOW (ref 12.0–15.0)
LYMPHS ABS: 2.4 10*3/uL (ref 0.7–4.0)
LYMPHS PCT: 47 %
MCH: 24.1 pg — AB (ref 26.0–34.0)
MCHC: 30.8 g/dL (ref 30.0–36.0)
MCV: 78.3 fL (ref 78.0–100.0)
MONO ABS: 0.5 10*3/uL (ref 0.1–1.0)
MONOS PCT: 9 %
NEUTROS ABS: 2 10*3/uL (ref 1.7–7.7)
Neutrophils Relative %: 40 %
Platelets: 230 10*3/uL (ref 150–400)
RBC: 3.82 MIL/uL — ABNORMAL LOW (ref 3.87–5.11)
RDW: 18.9 % — AB (ref 11.5–15.5)
WBC: 5.1 10*3/uL (ref 4.0–10.5)

## 2017-08-12 LAB — BASIC METABOLIC PANEL
ANION GAP: 4 — AB (ref 5–15)
BUN: 13 mg/dL (ref 6–20)
CALCIUM: 8.2 mg/dL — AB (ref 8.9–10.3)
CO2: 22 mmol/L (ref 22–32)
Chloride: 115 mmol/L — ABNORMAL HIGH (ref 101–111)
Creatinine, Ser: <0.3 mg/dL — ABNORMAL LOW (ref 0.44–1.00)
GLUCOSE: 93 mg/dL (ref 65–99)
Potassium: 3.5 mmol/L (ref 3.5–5.1)
Sodium: 141 mmol/L (ref 135–145)

## 2017-08-12 LAB — IRON AND TIBC
Iron: 14 ug/dL — ABNORMAL LOW (ref 28–170)
SATURATION RATIOS: 5 % — AB (ref 10.4–31.8)
TIBC: 269 ug/dL (ref 250–450)
UIBC: 255 ug/dL

## 2017-08-12 LAB — FERRITIN: Ferritin: 20 ng/mL (ref 11–307)

## 2017-08-12 MED ORDER — ADULT MULTIVITAMIN W/MINERALS CH
1.0000 | ORAL_TABLET | Freq: Every day | ORAL | Status: DC
  Administered 2017-08-12: 1 via ORAL
  Filled 2017-08-12 (×3): qty 1

## 2017-08-12 MED ORDER — SODIUM CHLORIDE 0.9 % IV SOLN
510.0000 mg | Freq: Once | INTRAVENOUS | Status: AC
  Administered 2017-08-12: 510 mg via INTRAVENOUS
  Filled 2017-08-12: qty 17
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
## 2017-08-12 NOTE — Progress Notes (Signed)
[facility] for Infectious Disease   Reason for visit: Follow up on chronic wound  Interval History: xray with chronic findings, no fever, normal WBC; no associated n/v.  No rash  xray of pelvis independently reviewed and chronic lytic lesions noted.   Physical Exam: Constitutional:  Vitals:   08/12/17 0544 08/12/17 1000  BP: (!) 94/51 105/74  Pulse: 70 89  Resp: 16 16  Temp: 98.4 F (36.9 C) 98.7 F (37.1 C)  SpO2: 100% 100%   patient appears in NAD HENT: no thrush Respiratory: Normal respiratory effort; CTA B Cardiovascular: RRR Chest: right picc   Review of Systems: Constitutional: negative for fevers and chills Gastrointestinal: negative for diarrhea Integument/breast: negative for rash  Lab Results  Component Value Date   WBC 5.1 08/12/2017   HGB 9.2 (L) 08/12/2017   HCT 29.9 (L) 08/12/2017   MCV 78.3 08/12/2017   PLT 230 08/12/2017    Lab Results  Component Value Date   CREATININE <0.30 (L) 08/12/2017   BUN 13 08/12/2017   NA 141 08/12/2017   K 3.5 08/12/2017   CL 115 (H) 08/12/2017   CO2 22 08/12/2017    Lab Results  Component Value Date   ALT 5 (L) 08/10/2017   AST 13 (L) 08/10/2017   ALKPHOS 60 08/10/2017     Microbiology: Recent Results (from the past 240 hour(s))  Blood Culture (routine x 2)     Status: None (Preliminary result)   Collection Time: 08/10/17  3:22 PM  Result Value Ref Range Status   Specimen Description BLOOD BLOOD LEFT FOREARM  Final   Special Requests   Final    BOTTLES DRAWN AEROBIC AND ANAEROBIC Blood Culture adequate volume   Culture   Final    NO GROWTH 1 DAY Performed at [facility], [address], [place], [place] [address]    Report Status PENDING  Incomplete  MRSA PCR Screening     Status: None   Collection Time: 08/10/17  9:53 PM  Result Value Ref Range Status   MRSA by PCR NEGATIVE NEGATIVE Final    Comment:        The GeneXpert MRSA Assay (FDA approved for NASAL specimens only), is one  component of a comprehensive MRSA colonization surveillance program. It is not intended to diagnose MRSA infection nor to guide or monitor treatment for MRSA infections.     Impression/Plan:  1. Chronic ischial wound - no current signs of infection.  Drainage has been serosanguionous, no pus according to the patient, has not improved. She has not been able to get adequate wound care supplies.  As noted yesterday, there is no signs on exam of infection and no exposed bone, CRP and ESR not significant elevated, so I will stop the antibiotics.  She will need to continue wound care and have this arranged prior to discharge.  2.  picc line - no indication to keep this in and I have recommended removal to avoid infection.  She is in agreement.  Will need IR or surgery for removal prior to discharge.    3. Urine incontinence - she is interested in a second opinion regarding urostomy vs suprapubic catheter and would like to be referred to another urologist.

## 2017-08-13 ENCOUNTER — Encounter (HOSPITAL_COMMUNITY): Payer: Self-pay | Admitting: Diagnostic Radiology

## 2017-08-13 ENCOUNTER — Inpatient Hospital Stay (HOSPITAL_COMMUNITY): Payer: Medicare Other

## 2017-08-13 DIAGNOSIS — D638 Anemia in other chronic diseases classified elsewhere: Secondary | ICD-10-CM

## 2017-08-13 DIAGNOSIS — B999 Unspecified infectious disease: Secondary | ICD-10-CM

## 2017-08-13 HISTORY — PX: IR REMOVAL TUN CV CATH W/O FL: IMG2289

## 2017-08-13 LAB — CBC WITH DIFFERENTIAL/PLATELET
BASOS ABS: 0 10*3/uL (ref 0.0–0.1)
BASOS PCT: 0 %
EOS ABS: 0.2 10*3/uL (ref 0.0–0.7)
Eosinophils Relative: 4 %
HEMATOCRIT: 27.9 % — AB (ref 36.0–46.0)
HEMOGLOBIN: 8.7 g/dL — AB (ref 12.0–15.0)
Lymphocytes Relative: 45 %
Lymphs Abs: 2.3 10*3/uL (ref 0.7–4.0)
MCH: 24.4 pg — ABNORMAL LOW (ref 26.0–34.0)
MCHC: 31.2 g/dL (ref 30.0–36.0)
MCV: 78.2 fL (ref 78.0–100.0)
Monocytes Absolute: 0.5 10*3/uL (ref 0.1–1.0)
Monocytes Relative: 9 %
Neutro Abs: 2.1 10*3/uL (ref 1.7–7.7)
Neutrophils Relative %: 42 %
Platelets: 206 10*3/uL (ref 150–400)
RBC: 3.57 MIL/uL — ABNORMAL LOW (ref 3.87–5.11)
RDW: 18.8 % — AB (ref 11.5–15.5)
WBC: 5.1 10*3/uL (ref 4.0–10.5)

## 2017-08-13 LAB — BASIC METABOLIC PANEL
Anion gap: 5 (ref 5–15)
BUN: 11 mg/dL (ref 6–20)
CALCIUM: 8.2 mg/dL — AB (ref 8.9–10.3)
CHLORIDE: 112 mmol/L — AB (ref 101–111)
CO2: 22 mmol/L (ref 22–32)
Glucose, Bld: 87 mg/dL (ref 65–99)
Potassium: 3.4 mmol/L — ABNORMAL LOW (ref 3.5–5.1)
SODIUM: 139 mmol/L (ref 135–145)

## 2017-08-13 MED ORDER — LIDOCAINE HCL (PF) 2 % IJ SOLN
INTRAMUSCULAR | Status: AC
  Filled 2017-08-13: qty 10

## 2017-08-13 MED ORDER — POTASSIUM CHLORIDE CRYS ER 20 MEQ PO TBCR
40.0000 meq | EXTENDED_RELEASE_TABLET | Freq: Once | ORAL | Status: AC
  Administered 2017-08-13: 40 meq via ORAL
  Filled 2017-08-13: qty 2

## 2017-08-13 NOTE — Procedures (Signed)
Successful removal of right chest tunneled central line.  Minimal blood loss and no immediate complication.
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
## 2017-08-13 NOTE — Progress Notes (Signed)
Discharge planning, spoke with patient at beside. [facility], [facility]. No DME needs per patient, has all needed equipment. [facility]. Will continue to follow, awaiting final HH orders. [phone]

## 2017-08-13 NOTE — Progress Notes (Signed)
Patient taken down to IR for PICC DC. Lina SarBeth Karah Caruthers, RN

## 2017-08-13 NOTE — Care Management Important Message (Signed)
Important Message  Patient Details  Name: Kathryn Ewing MRN: [id number] Date of Birth: 07/14/1986   Medicare Important Message Given:  Yes    Caren MacadamFuller, Mikahla Wisor 08/13/2017, 10:16 AMImportant Message  Patient Details  Name: Kathryn Ewing MRN: [id number] Date of Birth: 09/22/1985   Medicare Important Message Given:  Yes    Caren MacadamFuller, Ysabel Cowgill 08/13/2017, 10:16 AM

## 2017-08-14 DIAGNOSIS — E876 Hypokalemia: Secondary | ICD-10-CM

## 2017-08-14 DIAGNOSIS — L89324 Pressure ulcer of left buttock, stage 4: Secondary | ICD-10-CM

## 2017-08-14 DIAGNOSIS — E538 Deficiency of other specified B group vitamins: Secondary | ICD-10-CM

## 2017-08-14 LAB — CBC WITH DIFFERENTIAL/PLATELET
Basophils Absolute: 0 10*3/uL (ref 0.0–0.1)
Basophils Relative: 0 %
EOS PCT: 4 %
Eosinophils Absolute: 0.3 10*3/uL (ref 0.0–0.7)
HEMATOCRIT: 29.5 % — AB (ref 36.0–46.0)
HEMOGLOBIN: 9.4 g/dL — AB (ref 12.0–15.0)
LYMPHS ABS: 3.1 10*3/uL (ref 0.7–4.0)
LYMPHS PCT: 48 %
MCH: 24.9 pg — AB (ref 26.0–34.0)
MCHC: 31.9 g/dL (ref 30.0–36.0)
MCV: 78.2 fL (ref 78.0–100.0)
Monocytes Absolute: 0.6 10*3/uL (ref 0.1–1.0)
Monocytes Relative: 9 %
NEUTROS ABS: 2.5 10*3/uL (ref 1.7–7.7)
NEUTROS PCT: 39 %
Platelets: 262 10*3/uL (ref 150–400)
RBC: 3.77 MIL/uL — AB (ref 3.87–5.11)
RDW: 19.1 % — ABNORMAL HIGH (ref 11.5–15.5)
WBC: 6.6 10*3/uL (ref 4.0–10.5)

## 2017-08-14 LAB — BASIC METABOLIC PANEL
ANION GAP: 4 — AB (ref 5–15)
BUN: 11 mg/dL (ref 6–20)
CHLORIDE: 110 mmol/L (ref 101–111)
CO2: 27 mmol/L (ref 22–32)
Calcium: 8.1 mg/dL — ABNORMAL LOW (ref 8.9–10.3)
Creatinine, Ser: 0.31 mg/dL — ABNORMAL LOW (ref 0.44–1.00)
GFR calc Af Amer: >60 mL/min (ref >60)
GLUCOSE: 83 mg/dL (ref 65–99)
POTASSIUM: 3.6 mmol/L (ref 3.5–5.1)
Sodium: 141 mmol/L (ref 135–145)

## 2017-08-14 MED ORDER — ADULT MULTIVITAMIN W/MINERALS CH: 1.0000 | ORAL_TABLET | Freq: Every day | ORAL | 1 refills | Status: DC

## 2017-08-14 MED ORDER — MORPHINE SULFATE 15 MG PO TABS: 15.0000 mg | ORAL_TABLET | Freq: Four times a day (QID) | ORAL | 0 refills | Status: DC | PRN

## 2017-08-14 MED ORDER — ENSURE ENLIVE PO LIQD: 237.0000 mL | Freq: Two times a day (BID) | ORAL | 12 refills | Status: DC

## 2017-08-14 MED ORDER — POLYETHYLENE GLYCOL 3350 17 G PO PACK: 17.0000 g | PACK | Freq: Every day | ORAL | 0 refills | Status: DC | PRN

## 2017-08-14 NOTE — Progress Notes (Signed)
Discharge instructions were reviewed and prescriptions were given to the patient. I explained the importance of keeping appointments and having home health come to assist with wound care. Patient is refusing to go to a PCP because transportation is an issue at this time. Patient was taken to main entrance via wheelchair..Kathryn Ewing

## 2017-08-14 NOTE — Progress Notes (Signed)
Spoke with patient at bedside again, she states now has not seen a doctor in over a year except in the hospital. States she does not have a PCP, [place], she states she has called several practices but they have declined her d/t her parapalegia, [facility] because [facility] does not have [facility]. When asked about [facility] she says she does not like them. She does not want to go to the wound center because they have tried to get her to start a treatment that will require her to come there 4x per week and she is unable to get there. Made Dr. Blake DivineAkula aware of barriers. [facility] to see if she has other suggestions. [phone]

## 2017-08-16 LAB — CULTURE, BLOOD (ROUTINE X 2)
CULTURE: NO GROWTH
Special Requests: ADEQUATE
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
## 2022-01-31 ENCOUNTER — Emergency Department: Payer: 59

## 2022-01-31 ENCOUNTER — Other Ambulatory Visit: Payer: Self-pay

## 2022-01-31 ENCOUNTER — Inpatient Hospital Stay
Admission: EM | Admit: 2022-01-31 | Discharge: 2022-02-04 | DRG: 392 | Disposition: A | Discharge status: Home / Self Care | Payer: 59 | Attending: Hospitalist | Admitting: Hospitalist

## 2022-01-31 DIAGNOSIS — Z87891 Personal history of nicotine dependence: Secondary | ICD-10-CM | POA: Diagnosis not present

## 2022-01-31 DIAGNOSIS — M86652 Other chronic osteomyelitis, left thigh: Secondary | ICD-10-CM | POA: Diagnosis present

## 2022-01-31 DIAGNOSIS — E872 Acidosis, unspecified: Secondary | ICD-10-CM | POA: Diagnosis present

## 2022-01-31 DIAGNOSIS — Z8249 Family history of ischemic heart disease and other diseases of the circulatory system: Secondary | ICD-10-CM

## 2022-01-31 DIAGNOSIS — Z880 Allergy status to penicillin: Secondary | ICD-10-CM

## 2022-01-31 DIAGNOSIS — M4628 Osteomyelitis of vertebra, sacral and sacrococcygeal region: Secondary | ICD-10-CM | POA: Diagnosis present

## 2022-01-31 DIAGNOSIS — Z888 Allergy status to other drugs, medicaments and biological substances status: Secondary | ICD-10-CM

## 2022-01-31 DIAGNOSIS — Z89511 Acquired absence of right leg below knee: Secondary | ICD-10-CM

## 2022-01-31 DIAGNOSIS — E876 Hypokalemia: Secondary | ICD-10-CM | POA: Diagnosis present

## 2022-01-31 DIAGNOSIS — R112 Nausea with vomiting, unspecified: Secondary | ICD-10-CM | POA: Diagnosis present

## 2022-01-31 DIAGNOSIS — Z833 Family history of diabetes mellitus: Secondary | ICD-10-CM

## 2022-01-31 DIAGNOSIS — D72829 Elevated white blood cell count, unspecified: Secondary | ICD-10-CM | POA: Diagnosis not present

## 2022-01-31 DIAGNOSIS — N319 Neuromuscular dysfunction of bladder, unspecified: Secondary | ICD-10-CM | POA: Diagnosis present

## 2022-01-31 DIAGNOSIS — M25552 Pain in left hip: Secondary | ICD-10-CM | POA: Diagnosis not present

## 2022-01-31 DIAGNOSIS — Z885 Allergy status to narcotic agent status: Secondary | ICD-10-CM | POA: Diagnosis not present

## 2022-01-31 DIAGNOSIS — Z993 Dependence on wheelchair: Secondary | ICD-10-CM | POA: Diagnosis not present

## 2022-01-31 DIAGNOSIS — R7989 Other specified abnormal findings of blood chemistry: Secondary | ICD-10-CM | POA: Diagnosis not present

## 2022-01-31 DIAGNOSIS — R1084 Generalized abdominal pain: Secondary | ICD-10-CM | POA: Diagnosis present

## 2022-01-31 DIAGNOSIS — K8020 Calculus of gallbladder without cholecystitis without obstruction: Secondary | ICD-10-CM | POA: Diagnosis present

## 2022-01-31 DIAGNOSIS — G8220 Paraplegia, unspecified: Secondary | ICD-10-CM | POA: Diagnosis present

## 2022-01-31 DIAGNOSIS — F1290 Cannabis use, unspecified, uncomplicated: Secondary | ICD-10-CM | POA: Diagnosis present

## 2022-01-31 DIAGNOSIS — S24103S Unspecified injury at T7-T10 level of thoracic spinal cord, sequela: Secondary | ICD-10-CM | POA: Diagnosis not present

## 2022-01-31 DIAGNOSIS — R6510 Systemic inflammatory response syndrome (SIRS) of non-infectious origin without acute organ dysfunction: Secondary | ICD-10-CM | POA: Diagnosis present

## 2022-01-31 DIAGNOSIS — Z89512 Acquired absence of left leg below knee: Secondary | ICD-10-CM | POA: Diagnosis not present

## 2022-01-31 DIAGNOSIS — G8929 Other chronic pain: Secondary | ICD-10-CM | POA: Diagnosis present

## 2022-01-31 DIAGNOSIS — M8660 Other chronic osteomyelitis, unspecified site: Secondary | ICD-10-CM | POA: Diagnosis present

## 2022-01-31 DIAGNOSIS — M86651 Other chronic osteomyelitis, right thigh: Secondary | ICD-10-CM | POA: Diagnosis present

## 2022-01-31 DIAGNOSIS — M549 Dorsalgia, unspecified: Secondary | ICD-10-CM | POA: Diagnosis present

## 2022-01-31 DIAGNOSIS — I959 Hypotension, unspecified: Secondary | ICD-10-CM | POA: Diagnosis not present

## 2022-01-31 DIAGNOSIS — F419 Anxiety disorder, unspecified: Secondary | ICD-10-CM | POA: Diagnosis present

## 2022-01-31 DIAGNOSIS — A419 Sepsis, unspecified organism: Secondary | ICD-10-CM | POA: Diagnosis present

## 2022-01-31 DIAGNOSIS — M25559 Pain in unspecified hip: Secondary | ICD-10-CM

## 2022-01-31 DIAGNOSIS — R9431 Abnormal electrocardiogram [ECG] [EKG]: Secondary | ICD-10-CM | POA: Diagnosis not present

## 2022-01-31 DIAGNOSIS — M62838 Other muscle spasm: Secondary | ICD-10-CM | POA: Diagnosis not present

## 2022-01-31 LAB — URINALYSIS, COMPLETE (UACMP) WITH MICROSCOPIC
Bacteria, UA: NONE SEEN
Bilirubin Urine: NEGATIVE
Glucose, UA: NEGATIVE mg/dL
Hgb urine dipstick: NEGATIVE
Ketones, ur: 20 mg/dL — AB
Leukocytes,Ua: NEGATIVE
Nitrite: NEGATIVE
Protein, ur: NEGATIVE mg/dL
Specific Gravity, Urine: 1.025 (ref 1.005–1.030)
Squamous Epithelial / HPF: NONE SEEN (ref 0–5)
pH: 7 (ref 5.0–8.0)

## 2022-01-31 LAB — MAGNESIUM: Magnesium: 2 mg/dL (ref 1.7–2.4)

## 2022-01-31 LAB — COMPREHENSIVE METABOLIC PANEL
ALT: 14 U/L (ref 0–44)
AST: 23 U/L (ref 15–41)
Albumin: 4.5 g/dL (ref 3.5–5.0)
Alkaline Phosphatase: 60 U/L (ref 38–126)
Anion gap: 13 (ref 5–15)
BUN: 18 mg/dL (ref 6–20)
CO2: 17 mmol/L — ABNORMAL LOW (ref 22–32)
Calcium: 9.1 mg/dL (ref 8.9–10.3)
Chloride: 108 mmol/L (ref 98–111)
Creatinine, Ser: 0.47 mg/dL (ref 0.44–1.00)
GFR, Estimated: >60 mL/min (ref >60)
Glucose, Bld: 159 mg/dL — ABNORMAL HIGH (ref 70–99)
Potassium: 3.2 mmol/L — ABNORMAL LOW (ref 3.5–5.1)
Sodium: 138 mmol/L (ref 135–145)
Total Bilirubin: 1.1 mg/dL (ref 0.3–1.2)
Total Protein: 8.3 g/dL — ABNORMAL HIGH (ref 6.5–8.1)

## 2022-01-31 LAB — LACTIC ACID, PLASMA
Lactic Acid, Venous: 2 mmol/L (ref 0.5–1.9)
Lactic Acid, Venous: 1.4 mmol/L (ref 0.5–1.9)

## 2022-01-31 LAB — PROCALCITONIN: Procalcitonin: <0.1 ng/mL

## 2022-01-31 LAB — BASIC METABOLIC PANEL
Anion gap: 5 (ref 5–15)
BUN: 7 mg/dL (ref 6–20)
CO2: 23 mmol/L (ref 22–32)
Calcium: 8.2 mg/dL — ABNORMAL LOW (ref 8.9–10.3)
Chloride: 112 mmol/L — ABNORMAL HIGH (ref 98–111)
Creatinine, Ser: <0.3 mg/dL — ABNORMAL LOW (ref 0.44–1.00)
Glucose, Bld: 91 mg/dL (ref 70–99)
Potassium: 3.2 mmol/L — ABNORMAL LOW (ref 3.5–5.1)
Sodium: 140 mmol/L (ref 135–145)

## 2022-01-31 LAB — C-REACTIVE PROTEIN: CRP: 2.4 mg/dL — ABNORMAL HIGH (ref <1.0)

## 2022-01-31 LAB — CBC
HCT: 47.5 % — ABNORMAL HIGH (ref 36.0–46.0)
Hemoglobin: 15.9 g/dL — ABNORMAL HIGH (ref 12.0–15.0)
MCH: 30.6 pg (ref 26.0–34.0)
MCHC: 33.5 g/dL (ref 30.0–36.0)
MCV: 91.5 fL (ref 80.0–100.0)
Platelets: 317 10*3/uL (ref 150–400)
RBC: 5.19 MIL/uL — ABNORMAL HIGH (ref 3.87–5.11)
RDW: 13.3 % (ref 11.5–15.5)
WBC: 28.1 10*3/uL — ABNORMAL HIGH (ref 4.0–10.5)
nRBC: 0 % (ref 0.0–0.2)

## 2022-01-31 LAB — TROPONIN I (HIGH SENSITIVITY): Troponin I (High Sensitivity): 6 ng/L (ref <18)

## 2022-01-31 LAB — HCG, QUANTITATIVE, PREGNANCY: hCG, Beta Chain, Quant, S: <1 m[IU]/mL (ref <5)

## 2022-01-31 LAB — LIPASE, BLOOD: Lipase: 24 U/L (ref 11–51)

## 2022-01-31 MED ORDER — DROPERIDOL 2.5 MG/ML IJ SOLN
2.5000 mg | Freq: Once | INTRAMUSCULAR | Status: AC
  Administered 2022-01-31: 2.5 mg via INTRAVENOUS

## 2022-01-31 MED ORDER — DIPHENHYDRAMINE HCL 50 MG/ML IJ SOLN: 50.0000 mg | Freq: Once | INTRAMUSCULAR | Status: DC

## 2022-01-31 MED ORDER — VANCOMYCIN HCL IN DEXTROSE 1-5 GM/200ML-% IV SOLN
1000.0000 mg | Freq: Once | INTRAVENOUS | Status: AC
  Administered 2022-01-31: 1000 mg via INTRAVENOUS
  Filled 2022-01-31: qty 200

## 2022-01-31 MED ORDER — CIPROFLOXACIN IN D5W 400 MG/200ML IV SOLN
400.0000 mg | Freq: Once | INTRAVENOUS | Status: AC
  Administered 2022-01-31: 400 mg via INTRAVENOUS
  Filled 2022-01-31: qty 200

## 2022-01-31 MED ORDER — IOHEXOL 300 MG/ML  SOLN
100.0000 mL | Freq: Once | INTRAMUSCULAR | Status: AC | PRN
  Administered 2022-01-31: 100 mL via INTRAVENOUS

## 2022-01-31 MED ORDER — MORPHINE SULFATE (PF) 2 MG/ML IV SOLN
2.0000 mg | INTRAVENOUS | Status: DC | PRN
  Administered 2022-01-31 – 2022-02-01 (×2): 2 mg via INTRAVENOUS
  Filled 2022-01-31 (×2): qty 1

## 2022-01-31 MED ORDER — LACTATED RINGERS IV BOLUS
1000.0000 mL | Freq: Once | INTRAVENOUS | Status: AC
  Administered 2022-01-31: 1000 mL via INTRAVENOUS

## 2022-01-31 MED ORDER — MORPHINE SULFATE 15 MG PO TABS
15.0000 mg | ORAL_TABLET | Freq: Four times a day (QID) | ORAL | Status: DC | PRN
  Filled 2022-01-31: qty 1

## 2022-01-31 MED ORDER — CEFTRIAXONE SODIUM 2 G IJ SOLR
2.0000 g | Freq: Once | INTRAMUSCULAR | Status: AC
  Administered 2022-01-31: 2 g via INTRAVENOUS
  Filled 2022-01-31: qty 20

## 2022-01-31 MED ORDER — LACTATED RINGERS IV SOLN
INTRAVENOUS | Status: DC
Start: 2022-01-31 — End: 2022-01-31

## 2022-01-31 MED ORDER — ONDANSETRON HCL 4 MG/2ML IJ SOLN
4.0000 mg | Freq: Four times a day (QID) | INTRAMUSCULAR | Status: DC | PRN
  Administered 2022-01-31 – 2022-02-03 (×8): 4 mg via INTRAVENOUS
  Filled 2022-01-31 (×8): qty 2

## 2022-01-31 MED ORDER — MORPHINE SULFATE (PF) 4 MG/ML IV SOLN
4.0000 mg | Freq: Once | INTRAVENOUS | Status: AC
  Administered 2022-01-31: 4 mg via INTRAVENOUS
  Filled 2022-01-31: qty 1

## 2022-01-31 MED ORDER — METRONIDAZOLE 500 MG/100ML IV SOLN
500.0000 mg | Freq: Once | INTRAVENOUS | Status: AC
  Administered 2022-01-31: 500 mg via INTRAVENOUS
  Filled 2022-01-31: qty 100

## 2022-01-31 MED ORDER — ONDANSETRON HCL 4 MG PO TABS: 4.0000 mg | ORAL_TABLET | Freq: Four times a day (QID) | ORAL | Status: DC | PRN

## 2022-01-31 MED ORDER — METOCLOPRAMIDE HCL 5 MG/ML IJ SOLN
10.0000 mg | Freq: Once | INTRAMUSCULAR | Status: AC
  Administered 2022-01-31: 10 mg via INTRAVENOUS
  Filled 2022-01-31: qty 2

## 2022-01-31 MED ORDER — ENOXAPARIN SODIUM 40 MG/0.4ML IJ SOSY
40.0000 mg | PREFILLED_SYRINGE | INTRAMUSCULAR | Status: DC
  Filled 2022-01-31 (×4): qty 0.4

## 2022-01-31 MED ORDER — SODIUM CHLORIDE 0.9 % IV SOLN: 2.0000 g | Freq: Once | INTRAVENOUS | Status: DC

## 2022-01-31 MED ORDER — ONDANSETRON HCL 4 MG/2ML IJ SOLN
4.0000 mg | Freq: Once | INTRAMUSCULAR | Status: AC
  Administered 2022-01-31: 4 mg via INTRAVENOUS
  Filled 2022-01-31: qty 2

## 2022-01-31 MED ORDER — PANTOPRAZOLE SODIUM 40 MG IV SOLR
40.0000 mg | Freq: Once | INTRAVENOUS | Status: AC
  Administered 2022-01-31: 40 mg via INTRAVENOUS
  Filled 2022-01-31: qty 10

## 2022-01-31 MED ORDER — HYDROMORPHONE HCL 1 MG/ML IJ SOLN
1.0000 mg | Freq: Once | INTRAMUSCULAR | Status: AC
  Administered 2022-01-31: 1 mg via INTRAVENOUS
  Filled 2022-01-31: qty 1

## 2022-01-31 MED ORDER — POTASSIUM CHLORIDE 10 MEQ/100ML IV SOLN
10.0000 meq | INTRAVENOUS | Status: AC
  Administered 2022-01-31 (×2): 10 meq via INTRAVENOUS
  Filled 2022-01-31 (×2): qty 100

## 2022-01-31 MED ORDER — PANTOPRAZOLE SODIUM 40 MG IV SOLR: 40.0000 mg | INTRAVENOUS | Status: DC

## 2022-01-31 MED ORDER — PANTOPRAZOLE SODIUM 40 MG IV SOLR
40.0000 mg | INTRAVENOUS | Status: DC
  Administered 2022-02-01 – 2022-02-04 (×4): 40 mg via INTRAVENOUS
  Filled 2022-01-31 (×4): qty 10

## 2022-01-31 MED ORDER — POTASSIUM CHLORIDE 2 MEQ/ML IV SOLN
INTRAVENOUS | Status: DC
  Filled 2022-01-31 (×7): qty 1000

## 2022-01-31 MED ORDER — ADULT MULTIVITAMIN W/MINERALS CH
1.0000 | ORAL_TABLET | Freq: Every day | ORAL | Status: DC
  Administered 2022-02-03: 1 via ORAL
  Filled 2022-01-31 (×4): qty 1

## 2022-01-31 MED ORDER — DIPHENHYDRAMINE HCL 50 MG/ML IJ SOLN
INTRAMUSCULAR | Status: AC
  Administered 2022-01-31: 50 mg via INTRAMUSCULAR
  Filled 2022-01-31: qty 1
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
## 2022-01-31 NOTE — ED Notes (Signed)
Lab called to assist with repeat lactic and second set of blood cultures
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
## 2022-01-31 NOTE — Assessment & Plan Note (Addendum)
Imaging shows findings concerning for chronic osteomyelitis.  This findings are not new, dating back at least a year go. Patient has been evaluated by orthopedic surgery and no need for any intervention at this time
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
## 2022-01-31 NOTE — Consult Note (Signed)
PHARMACY -  BRIEF ANTIBIOTIC NOTE   Pharmacy has received consult(s) for aztreonam and vancomycin from an ED provider. Patient is also ordered metronidazole. The patient's profile has been reviewed for ht/wt/allergies/indication/available labs.   Noted multiple antibiotic intolerances (nausea / vomiting). Patient developed erythema and hives with ciprofloxacin infusion in ED. Per consult will change aztreonam to ceftriaxone. Patient has tolerated cephalosporins in the past.   One time order(s) placed for  --Ceftriaxone 2 g IV --Vancomycin 1 g IV  Further antibiotics/pharmacy consults should be ordered by admitting physician if indicated.                       Thank you, Tressie Ellis 01/31/2022  9:44 AM

## 2022-01-31 NOTE — ED Notes (Signed)
Pt resting at this time. NAD noted. Call bell in reach,  Pt denies any needs at this time.   LA results given to Dr. Michiel Sites  EKG obtained per MD request

## 2022-01-31 NOTE — Progress Notes (Signed)
Elink following for sepsis protocol. 

## 2022-01-31 NOTE — Assessment & Plan Note (Addendum)
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  Pt continued to have N/V despite zofran, compazine, ativan and scheduled reglan.   Plan: --encourage hot shower (pt so far refused) --IV haldol PRN and IV droperidol for N/V --d/c scheduled reglan --cont clear liquid diet (family to avoid bringing in outside food) --ativan, zofran, compazine PRN --MIVF@50 

## 2022-01-31 NOTE — Assessment & Plan Note (Addendum)
Patient presents for evaluation of nausea and vomiting and had a CT scan of abdomen and pelvis which shows cholelithiasis without any evidence of acute cholecystitis  --this finding also dates back a year ago. Patient was seen by surgery and no intervention is recommended at this time

## 2022-01-31 NOTE — ED Notes (Signed)
Straight kit cath given to pt to straight cath.    Pt given ice chips per request  Husband at bedside

## 2022-01-31 NOTE — ED Notes (Signed)
This RN went to check on pt, IV site where cipro was infusing was turning red and hives started to happen, Cipro stopped, MD aware, new orders see MAR.

## 2022-01-31 NOTE — Consult Note (Signed)
CODE SEPSIS - PHARMACY COMMUNICATION  **Broad Spectrum Antibiotics should be administered within 1 hour of Sepsis diagnosis**  Time Code Sepsis Called/Page Received: 1941  Antibiotics Ordered: 7408  Time of 1st antibiotic administration: 0758  Additional action taken by pharmacy: n/a  If necessary, Name of Provider/Nurse Contacted: N/A    Derrek Gu ,PharmD Clinical Pharmacist  01/31/2022  7:44 AM

## 2022-01-31 NOTE — Assessment & Plan Note (Addendum)
Patient does self catheterizations at least 4 times a day at home. --pt requested Foley placement while in the hospital --cont Foley

## 2022-01-31 NOTE — Assessment & Plan Note (Addendum)
Patient was tachycardic, tachypneic upon presentation and had an episode of hypotension that responded to IV fluid resuscitation. She has marked leukocytosis and elevated lactic acid that shows a downward trend concerning for sepsis. No obvious infectious source at this time --She received a dose of vancomycin, Rocephin and Flagyl in the ER We will hold off on further antibiotic therapy

## 2022-01-31 NOTE — ED Notes (Addendum)
Second LA and BC not obtained due to neither lines draw back and pt requests [place] IV, order was placed as well as comments to draw second set of cultures and LA, MD aware.

## 2022-01-31 NOTE — ED Notes (Signed)
Pt given straight cath kit to cath

## 2022-01-31 NOTE — Progress Notes (Signed)
CODE SEPSIS - PHARMACY COMMUNICATION  **Broad Spectrum Antibiotics should be administered within 1 hour of Sepsis diagnosis**  Time Code Sepsis Called/Page Received: 4627  Antibiotics Ordered: Ciprofloxacin + Flagyl >> vancomycin + ceftriaxone  Ciprofloxacin stopped early given development of erythema and hives  Time of 1st antibiotic administration: 1115  Additional action taken by pharmacy:  --Spoke with RN around 11 am. Ceftriaxone not yet administered. Limited line access. Pending IV team consult.   Tressie Ellis 01/31/2022  9:48 AM

## 2022-01-31 NOTE — ED Notes (Signed)
Pt offered zofran, states it does not work for her and wants to wait till IV meds given.

## 2022-01-31 NOTE — ED Notes (Signed)
Informed RN bed assigned 

## 2022-01-31 NOTE — ED Notes (Signed)
Pt at CT

## 2022-01-31 NOTE — Assessment & Plan Note (Addendum)
Patient has normal anion gap metabolic acidosis with an elevated lactic acid level of 2 most likely related to bicarbonate losses from diarrhea. --normalized with IVF

## 2022-01-31 NOTE — ED Triage Notes (Signed)
Pt in with co vomiting for 2 days, denies any diarrhea. Pt took zofran at home without relief.
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
## 2022-01-31 NOTE — ED Notes (Signed)
IV team at bedside, IV team did not draw repeat lactic or second set of blood cultures that were added in comments when IV team consult order was placed

## 2022-01-31 NOTE — ED Notes (Signed)
Report to Nathan, RN.

## 2022-01-31 NOTE — Assessment & Plan Note (Addendum)
Secondary to GI loss from nausea, vomiting Supplement potassium via MIVF
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
## 2022-02-01 ENCOUNTER — Encounter: Payer: Self-pay | Admitting: Internal Medicine

## 2022-02-01 DIAGNOSIS — F419 Anxiety disorder, unspecified: Secondary | ICD-10-CM

## 2022-02-01 DIAGNOSIS — R112 Nausea with vomiting, unspecified: Secondary | ICD-10-CM

## 2022-02-01 LAB — URINE CULTURE: Culture: NO GROWTH

## 2022-02-01 LAB — CBC
HCT: 43.7 % (ref 36.0–46.0)
Hemoglobin: 14.3 g/dL (ref 12.0–15.0)
MCH: 30.7 pg (ref 26.0–34.0)
MCHC: 32.7 g/dL (ref 30.0–36.0)
MCV: 93.8 fL (ref 80.0–100.0)
Platelets: 217 10*3/uL (ref 150–400)
RBC: 4.66 MIL/uL (ref 3.87–5.11)
RDW: 13.8 % (ref 11.5–15.5)
WBC: 15.6 10*3/uL — ABNORMAL HIGH (ref 4.0–10.5)
nRBC: 0 % (ref 0.0–0.2)

## 2022-02-01 LAB — BASIC METABOLIC PANEL
Anion gap: 9 (ref 5–15)
BUN: <5 mg/dL — ABNORMAL LOW (ref 6–20)
CO2: 21 mmol/L — ABNORMAL LOW (ref 22–32)
Calcium: 8.7 mg/dL — ABNORMAL LOW (ref 8.9–10.3)
Chloride: 110 mmol/L (ref 98–111)
Creatinine, Ser: <0.3 mg/dL — ABNORMAL LOW (ref 0.44–1.00)
Glucose, Bld: 96 mg/dL (ref 70–99)
Potassium: 3.6 mmol/L (ref 3.5–5.1)
Sodium: 140 mmol/L (ref 135–145)

## 2022-02-01 LAB — HIV ANTIBODY (ROUTINE TESTING W REFLEX): HIV Screen 4th Generation wRfx: NONREACTIVE

## 2022-02-01 LAB — PROCALCITONIN: Procalcitonin: <0.1 ng/mL

## 2022-02-01 LAB — SEDIMENTATION RATE: Sed Rate: 12 mm/hr (ref 0–20)

## 2022-02-01 MED ORDER — CHLORHEXIDINE GLUCONATE CLOTH 2 % EX PADS
6.0000 | MEDICATED_PAD | Freq: Every day | CUTANEOUS | Status: DC
  Administered 2022-02-01 – 2022-02-04 (×4): 6 via TOPICAL

## 2022-02-01 MED ORDER — LORAZEPAM 2 MG/ML IJ SOLN
1.0000 mg | Freq: Four times a day (QID) | INTRAMUSCULAR | Status: DC | PRN
  Administered 2022-02-01 – 2022-02-02 (×5): 1 mg via INTRAVENOUS
  Filled 2022-02-01 (×5): qty 1

## 2022-02-01 MED ORDER — DIPHENHYDRAMINE HCL 50 MG/ML IJ SOLN
25.0000 mg | Freq: Once | INTRAMUSCULAR | Status: AC
Start: 2022-02-01 — End: 2022-02-01
  Administered 2022-02-01: 25 mg via INTRAVENOUS
  Filled 2022-02-01: qty 1

## 2022-02-01 MED ORDER — PROCHLORPERAZINE EDISYLATE 10 MG/2ML IJ SOLN
10.0000 mg | Freq: Four times a day (QID) | INTRAMUSCULAR | Status: DC | PRN
  Administered 2022-02-01 – 2022-02-03 (×4): 10 mg via INTRAVENOUS
  Filled 2022-02-01 (×4): qty 2

## 2022-02-01 MED ORDER — FENTANYL CITRATE PF 50 MCG/ML IJ SOSY
12.5000 ug | PREFILLED_SYRINGE | INTRAMUSCULAR | Status: DC | PRN
  Administered 2022-02-01: 12.5 ug via INTRAVENOUS
  Filled 2022-02-01: qty 1
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
## 2022-02-01 NOTE — Assessment & Plan Note (Addendum)
--  pt said severe anxiety is worsening her N/V --IV Ativan 1 mg q6h wasn't lasting long enough.   --change to oral ativan 1 mg q6h PRN --resume home cymbalta (not on home med list, but today pt mentioned she takes it at home).

## 2022-02-01 NOTE — Progress Notes (Signed)
Cross Cover Patient with continued retractable nausea and vomiting.   Hydrocodone allergy listed with nausea and vomiting reaction, therefore, morphine changed to fentanyl (likely same reaction to morphine)

## 2022-02-02 ENCOUNTER — Inpatient Hospital Stay: Payer: Self-pay

## 2022-02-02 ENCOUNTER — Encounter: Payer: Self-pay | Admitting: Internal Medicine

## 2022-02-02 DIAGNOSIS — M25559 Pain in unspecified hip: Secondary | ICD-10-CM

## 2022-02-02 LAB — CBC
HCT: 42.8 % (ref 36.0–46.0)
Hemoglobin: 14 g/dL (ref 12.0–15.0)
MCH: 30.4 pg (ref 26.0–34.0)
MCHC: 32.7 g/dL (ref 30.0–36.0)
MCV: 93 fL (ref 80.0–100.0)
Platelets: 194 10*3/uL (ref 150–400)
RBC: 4.6 MIL/uL (ref 3.87–5.11)
RDW: 13.4 % (ref 11.5–15.5)
WBC: 12 10*3/uL — ABNORMAL HIGH (ref 4.0–10.5)
nRBC: 0 % (ref 0.0–0.2)

## 2022-02-02 LAB — BASIC METABOLIC PANEL
Anion gap: 8 (ref 5–15)
BUN: 5 mg/dL — ABNORMAL LOW (ref 6–20)
CO2: 25 mmol/L (ref 22–32)
Calcium: 8.7 mg/dL — ABNORMAL LOW (ref 8.9–10.3)
Chloride: 107 mmol/L (ref 98–111)
Creatinine, Ser: <0.3 mg/dL — ABNORMAL LOW (ref 0.44–1.00)
Glucose, Bld: 81 mg/dL (ref 70–99)
Potassium: 3.4 mmol/L — ABNORMAL LOW (ref 3.5–5.1)
Sodium: 140 mmol/L (ref 135–145)

## 2022-02-02 LAB — MAGNESIUM: Magnesium: 1.8 mg/dL (ref 1.7–2.4)

## 2022-02-02 MED ORDER — POTASSIUM CHLORIDE 2 MEQ/ML IV SOLN
INTRAVENOUS | Status: DC
  Filled 2022-02-02 (×5): qty 1000

## 2022-02-02 MED ORDER — DULOXETINE HCL 30 MG PO CPEP
30.0000 mg | ORAL_CAPSULE | Freq: Every day | ORAL | Status: DC
  Administered 2022-02-02 – 2022-02-04 (×3): 30 mg via ORAL
  Filled 2022-02-02 (×3): qty 1

## 2022-02-02 MED ORDER — KETOROLAC TROMETHAMINE 15 MG/ML IJ SOLN
15.0000 mg | Freq: Four times a day (QID) | INTRAMUSCULAR | Status: AC | PRN
  Administered 2022-02-02 – 2022-02-03 (×5): 15 mg via INTRAVENOUS
  Filled 2022-02-02 (×5): qty 1

## 2022-02-02 MED ORDER — DEXTROSE 5 % IV SOLN
500.0000 mg | Freq: Three times a day (TID) | INTRAVENOUS | Status: DC
  Administered 2022-02-02 – 2022-02-04 (×6): 500 mg via INTRAVENOUS
  Filled 2022-02-02: qty 5
  Filled 2022-02-02: qty 500
  Filled 2022-02-02 (×5): qty 5
  Filled 2022-02-02: qty 500

## 2022-02-02 MED ORDER — METOCLOPRAMIDE HCL 5 MG/ML IJ SOLN
10.0000 mg | Freq: Four times a day (QID) | INTRAMUSCULAR | Status: DC
  Administered 2022-02-02 – 2022-02-03 (×4): 10 mg via INTRAVENOUS
  Filled 2022-02-02 (×4): qty 2

## 2022-02-02 MED ORDER — POTASSIUM CHLORIDE 10 MEQ/100ML IV SOLN
10.0000 meq | INTRAVENOUS | Status: DC
  Filled 2022-02-02: qty 100

## 2022-02-02 MED ORDER — LORAZEPAM 2 MG/ML PO CONC
1.0000 mg | Freq: Four times a day (QID) | ORAL | Status: DC | PRN
Start: 2022-02-02 — End: 2022-02-04
  Administered 2022-02-02 – 2022-02-03 (×3): 1 mg via ORAL
  Filled 2022-02-02 (×3): qty 1
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
## 2022-02-02 NOTE — Progress Notes (Signed)
Peripherally Inserted Central Catheter Placement  The IV Nurse has discussed with the patient and/or persons authorized to consent for the patient, the purpose of this procedure and the potential benefits and risks involved with this procedure.  The benefits include less needle sticks, lab draws from the catheter, and the patient may be discharged home with the catheter. Risks include, but not limited to, infection, bleeding, blood clot (thrombus formation), and puncture of an artery; nerve damage and irregular heartbeat and possibility to perform a PICC exchange if needed/ordered by physician.  Alternatives to this procedure were also discussed.  Bard Power PICC patient education guide, fact sheet on infection prevention and patient information card has been provided to patient /or left at bedside.    PICC Placement Documentation  PICC Double Lumen 02/02/22 Right Brachial 39 cm 1 cm (Active)  Indication for Insertion or Continuance of Line Poor Vasculature-patient has had multiple peripheral attempts or PIVs lasting less than 24 hours 02/02/22 1753  Exposed Catheter (cm) 1 cm 02/02/22 1753  Site Assessment Clean, Dry, Intact 02/02/22 1753  Lumen #1 Status Flushed;Blood return noted;Saline locked 02/02/22 1753  Lumen #2 Status Flushed;Blood return noted;Saline locked 02/02/22 1753  Dressing Type Transparent 02/02/22 1753  Dressing Status Antimicrobial disc in place 02/02/22 1753  Dressing Change Due 02/09/22 02/02/22 1753       Audrie Gallus 02/02/2022, 5:55 PM

## 2022-02-02 NOTE — Progress Notes (Signed)
Patient very emotional states is feeling increased anxiety and is unable to control it.  Tried to assist with diversional activities, unsucessfully.  Patient states will call her mom to tried to calm down.  MD notified for additional medication.

## 2022-02-02 NOTE — Assessment & Plan Note (Signed)
Back pain --chronic pain.  No current opioids Rx.   Plan: --IV toradol PRN --IV robaxin scheduled --no IV opioids

## 2022-02-03 ENCOUNTER — Encounter: Payer: Self-pay | Admitting: Internal Medicine

## 2022-02-03 LAB — CBC
HCT: 40.2 % (ref 36.0–46.0)
Hemoglobin: 13.3 g/dL (ref 12.0–15.0)
MCH: 30.6 pg (ref 26.0–34.0)
MCHC: 33.1 g/dL (ref 30.0–36.0)
MCV: 92.4 fL (ref 80.0–100.0)
Platelets: 171 10*3/uL (ref 150–400)
RBC: 4.35 MIL/uL (ref 3.87–5.11)
RDW: 13.1 % (ref 11.5–15.5)
WBC: 10.8 10*3/uL — ABNORMAL HIGH (ref 4.0–10.5)
nRBC: 0 % (ref 0.0–0.2)

## 2022-02-03 LAB — BASIC METABOLIC PANEL
Anion gap: 9 (ref 5–15)
BUN: 10 mg/dL (ref 6–20)
CO2: 23 mmol/L (ref 22–32)
Calcium: 8.1 mg/dL — ABNORMAL LOW (ref 8.9–10.3)
Chloride: 107 mmol/L (ref 98–111)
Creatinine, Ser: 0.3 mg/dL — ABNORMAL LOW (ref 0.44–1.00)
GFR, Estimated: >60 mL/min (ref >60)
Glucose, Bld: 80 mg/dL (ref 70–99)
Potassium: 3 mmol/L — ABNORMAL LOW (ref 3.5–5.1)
Sodium: 139 mmol/L (ref 135–145)

## 2022-02-03 LAB — MAGNESIUM: Magnesium: 1.7 mg/dL (ref 1.7–2.4)

## 2022-02-03 MED ORDER — HALOPERIDOL LACTATE 5 MG/ML IJ SOLN
2.0000 mg | Freq: Four times a day (QID) | INTRAMUSCULAR | Status: DC | PRN
  Administered 2022-02-03 – 2022-02-04 (×2): 2 mg via INTRAVENOUS
  Filled 2022-02-03 (×2): qty 1

## 2022-02-03 MED ORDER — POTASSIUM CHLORIDE 10 MEQ/100ML IV SOLN
10.0000 meq | INTRAVENOUS | Status: AC
  Administered 2022-02-03 (×5): 10 meq via INTRAVENOUS
  Filled 2022-02-03 (×5): qty 100

## 2022-02-03 MED ORDER — DROPERIDOL 2.5 MG/ML IJ SOLN
2.5000 mg | Freq: Once | INTRAMUSCULAR | Status: AC
  Administered 2022-02-03: 2.5 mg via INTRAVENOUS
  Filled 2022-02-03: qty 2

## 2022-02-03 MED ORDER — KETOROLAC TROMETHAMINE 15 MG/ML IJ SOLN
15.0000 mg | Freq: Four times a day (QID) | INTRAMUSCULAR | Status: DC | PRN
  Administered 2022-02-04 (×2): 15 mg via INTRAVENOUS
  Filled 2022-02-03 (×2): qty 1
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
## 2022-02-04 ENCOUNTER — Other Ambulatory Visit: Payer: Self-pay

## 2022-02-04 ENCOUNTER — Observation Stay
Admission: EM | Admit: 2022-02-04 | Discharge: 2022-02-07 | Disposition: A | Discharge status: Home / Self Care | Payer: 59 | Attending: Internal Medicine | Admitting: Internal Medicine

## 2022-02-04 DIAGNOSIS — R9431 Abnormal electrocardiogram [ECG] [EKG]: Secondary | ICD-10-CM | POA: Insufficient documentation

## 2022-02-04 DIAGNOSIS — R112 Nausea with vomiting, unspecified: Secondary | ICD-10-CM | POA: Diagnosis not present

## 2022-02-04 DIAGNOSIS — Z89511 Acquired absence of right leg below knee: Secondary | ICD-10-CM | POA: Insufficient documentation

## 2022-02-04 DIAGNOSIS — M25552 Pain in left hip: Secondary | ICD-10-CM | POA: Insufficient documentation

## 2022-02-04 DIAGNOSIS — G8220 Paraplegia, unspecified: Secondary | ICD-10-CM | POA: Diagnosis present

## 2022-02-04 DIAGNOSIS — Z87891 Personal history of nicotine dependence: Secondary | ICD-10-CM | POA: Insufficient documentation

## 2022-02-04 DIAGNOSIS — N319 Neuromuscular dysfunction of bladder, unspecified: Secondary | ICD-10-CM | POA: Insufficient documentation

## 2022-02-04 DIAGNOSIS — Z89512 Acquired absence of left leg below knee: Secondary | ICD-10-CM | POA: Insufficient documentation

## 2022-02-04 DIAGNOSIS — M8660 Other chronic osteomyelitis, unspecified site: Secondary | ICD-10-CM | POA: Diagnosis present

## 2022-02-04 DIAGNOSIS — K8020 Calculus of gallbladder without cholecystitis without obstruction: Secondary | ICD-10-CM | POA: Diagnosis present

## 2022-02-04 DIAGNOSIS — M549 Dorsalgia, unspecified: Secondary | ICD-10-CM | POA: Insufficient documentation

## 2022-02-04 DIAGNOSIS — Z993 Dependence on wheelchair: Secondary | ICD-10-CM | POA: Insufficient documentation

## 2022-02-04 DIAGNOSIS — R7989 Other specified abnormal findings of blood chemistry: Secondary | ICD-10-CM | POA: Insufficient documentation

## 2022-02-04 DIAGNOSIS — D72829 Elevated white blood cell count, unspecified: Secondary | ICD-10-CM

## 2022-02-04 DIAGNOSIS — G8929 Other chronic pain: Secondary | ICD-10-CM | POA: Insufficient documentation

## 2022-02-04 DIAGNOSIS — M62838 Other muscle spasm: Secondary | ICD-10-CM | POA: Insufficient documentation

## 2022-02-04 DIAGNOSIS — S24103S Unspecified injury at T7-T10 level of thoracic spinal cord, sequela: Secondary | ICD-10-CM | POA: Insufficient documentation

## 2022-02-04 DIAGNOSIS — F1290 Cannabis use, unspecified, uncomplicated: Secondary | ICD-10-CM | POA: Insufficient documentation

## 2022-02-04 LAB — CBC
HCT: 38.6 % (ref 36.0–46.0)
HCT: 41 % (ref 36.0–46.0)
Hemoglobin: 12.7 g/dL (ref 12.0–15.0)
Hemoglobin: 13.5 g/dL (ref 12.0–15.0)
MCH: 30.7 pg (ref 26.0–34.0)
MCH: 30.8 pg (ref 26.0–34.0)
MCHC: 32.9 g/dL (ref 30.0–36.0)
MCHC: 32.9 g/dL (ref 30.0–36.0)
MCV: 93.2 fL (ref 80.0–100.0)
MCV: 93.4 fL (ref 80.0–100.0)
Platelets: 161 10*3/uL (ref 150–400)
Platelets: 169 10*3/uL (ref 150–400)
RBC: 4.14 MIL/uL (ref 3.87–5.11)
RBC: 4.39 MIL/uL (ref 3.87–5.11)
RDW: 13.1 % (ref 11.5–15.5)
RDW: 13.1 % (ref 11.5–15.5)
WBC: 9.6 10*3/uL (ref 4.0–10.5)
WBC: 15 10*3/uL — ABNORMAL HIGH (ref 4.0–10.5)
nRBC: 0 % (ref 0.0–0.2)
nRBC: 0 % (ref 0.0–0.2)

## 2022-02-04 LAB — COMPREHENSIVE METABOLIC PANEL
ALT: 31 U/L (ref 0–44)
AST: 26 U/L (ref 15–41)
Albumin: 3.8 g/dL (ref 3.5–5.0)
Alkaline Phosphatase: 46 U/L (ref 38–126)
Anion gap: 12 (ref 5–15)
BUN: 8 mg/dL (ref 6–20)
CO2: 18 mmol/L — ABNORMAL LOW (ref 22–32)
Calcium: 8.6 mg/dL — ABNORMAL LOW (ref 8.9–10.3)
Chloride: 108 mmol/L (ref 98–111)
Creatinine, Ser: <0.3 mg/dL — ABNORMAL LOW (ref 0.44–1.00)
Glucose, Bld: 99 mg/dL (ref 70–99)
Potassium: 4.1 mmol/L (ref 3.5–5.1)
Sodium: 138 mmol/L (ref 135–145)
Total Bilirubin: 1.8 mg/dL — ABNORMAL HIGH (ref 0.3–1.2)
Total Protein: 7 g/dL (ref 6.5–8.1)

## 2022-02-04 LAB — BASIC METABOLIC PANEL
Anion gap: 7 (ref 5–15)
BUN: 8 mg/dL (ref 6–20)
CO2: 22 mmol/L (ref 22–32)
Calcium: 8.4 mg/dL — ABNORMAL LOW (ref 8.9–10.3)
Chloride: 108 mmol/L (ref 98–111)
Creatinine, Ser: <0.3 mg/dL — ABNORMAL LOW (ref 0.44–1.00)
Glucose, Bld: 92 mg/dL (ref 70–99)
Potassium: 3.6 mmol/L (ref 3.5–5.1)
Sodium: 137 mmol/L (ref 135–145)

## 2022-02-04 LAB — LIPASE, BLOOD: Lipase: 24 U/L (ref 11–51)

## 2022-02-04 LAB — MAGNESIUM: Magnesium: 1.8 mg/dL (ref 1.7–2.4)

## 2022-02-04 MED ORDER — ONDANSETRON HCL 4 MG PO TABS: 4.0000 mg | ORAL_TABLET | Freq: Four times a day (QID) | ORAL | Status: DC | PRN

## 2022-02-04 MED ORDER — POTASSIUM CHLORIDE CRYS ER 20 MEQ PO TBCR
40.0000 meq | EXTENDED_RELEASE_TABLET | Freq: Every day | ORAL | 0 refills | Status: DC
Start: 2022-02-04 — End: 2022-02-07

## 2022-02-04 MED ORDER — NAPROXEN 500 MG PO TBEC: 500.0000 mg | DELAYED_RELEASE_TABLET | Freq: Two times a day (BID) | ORAL | 0 refills | Status: DC

## 2022-02-04 MED ORDER — PROCHLORPERAZINE EDISYLATE 10 MG/2ML IJ SOLN
10.0000 mg | INTRAMUSCULAR | Status: DC | PRN
Start: 2022-02-04 — End: 2022-02-05
  Administered 2022-02-05: 10 mg via INTRAVENOUS
  Filled 2022-02-04: qty 2

## 2022-02-04 MED ORDER — PROCHLORPERAZINE MALEATE 10 MG PO TABS: 10.0000 mg | ORAL_TABLET | Freq: Four times a day (QID) | ORAL | 0 refills | Status: DC | PRN

## 2022-02-04 MED ORDER — DULOXETINE HCL 30 MG PO CPEP: 30.0000 mg | ORAL_CAPSULE | Freq: Every day | ORAL | 3 refills | Status: DC

## 2022-02-04 MED ORDER — ONDANSETRON HCL 4 MG PO TABS: 4.0000 mg | ORAL_TABLET | Freq: Four times a day (QID) | ORAL | 0 refills | Status: DC | PRN

## 2022-02-04 MED ORDER — ACETAMINOPHEN 325 MG PO TABS: 650.0000 mg | ORAL_TABLET | Freq: Four times a day (QID) | ORAL | Status: DC | PRN

## 2022-02-04 MED ORDER — ENOXAPARIN SODIUM 40 MG/0.4ML IJ SOSY
40.0000 mg | PREFILLED_SYRINGE | INTRAMUSCULAR | Status: DC
  Administered 2022-02-06: 40 mg via SUBCUTANEOUS
  Filled 2022-02-04 (×2): qty 0.4

## 2022-02-04 MED ORDER — DROPERIDOL 2.5 MG/ML IJ SOLN
2.5000 mg | Freq: Once | INTRAMUSCULAR | Status: AC
  Administered 2022-02-04: 2.5 mg via INTRAVENOUS
  Filled 2022-02-04: qty 2

## 2022-02-04 MED ORDER — ONDANSETRON HCL 4 MG/2ML IJ SOLN
4.0000 mg | Freq: Four times a day (QID) | INTRAMUSCULAR | Status: DC | PRN
  Administered 2022-02-05 (×3): 4 mg via INTRAVENOUS
  Filled 2022-02-04 (×3): qty 2

## 2022-02-04 MED ORDER — POTASSIUM CHLORIDE CRYS ER 20 MEQ PO TBCR: 40.0000 meq | EXTENDED_RELEASE_TABLET | Freq: Every day | ORAL | 0 refills | Status: DC

## 2022-02-04 MED ORDER — DULOXETINE HCL 30 MG PO CPEP
30.0000 mg | ORAL_CAPSULE | Freq: Every day | ORAL | Status: DC
  Administered 2022-02-06: 30 mg via ORAL
  Filled 2022-02-04 (×2): qty 1

## 2022-02-04 MED ORDER — ACETAMINOPHEN 325 MG RE SUPP: 650.0000 mg | Freq: Four times a day (QID) | RECTAL | Status: DC | PRN

## 2022-02-04 MED ORDER — LACTATED RINGERS IV SOLN: INTRAVENOUS | Status: DC

## 2022-02-04 MED ORDER — PROCHLORPERAZINE MALEATE 10 MG PO TABS
10.0000 mg | ORAL_TABLET | Freq: Four times a day (QID) | ORAL | 0 refills | Status: DC | PRN
Start: 2022-02-04 — End: 2022-02-07

## 2022-02-04 NOTE — Assessment & Plan Note (Signed)
Increase nursing assistance if desired by patient 

## 2022-02-04 NOTE — ED Triage Notes (Signed)
Pt dcd from [facility] today and returning for same symptoms. Pt stating that their muscles have been spasming and they cannot turn their neck. Pt stating muscle cramping in neck, chest, hands and stomach at this time.,

## 2022-02-04 NOTE — Assessment & Plan Note (Signed)
Likely reactive and related to vomiting Continue to trend No antibiotics at this time

## 2022-02-04 NOTE — ED Notes (Signed)
Per lab lav top blood work hemolyzed - recollected and sent to lab at this time.
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
## 2022-02-04 NOTE — ED Provider Notes (Signed)
[facility] Provider Note    Event Date/Time   First MD Initiated Contact with Patient 02/04/22 1847     (approximate)   History   Nausea, Emesis, and Spasms   HPI  Kathryn Ewing is a 36 y.o. female with a history of paraplegia, chronic osteomyelitis, bilateral BKA who was just discharged from the hospital today after being admitted for intractable nausea vomiting.  Per review of inpatient records the patient had had nausea and vomiting throughout hospital stay but then suddenly this morning decided that she wanted to leave and go home and was feeling better.  Patient reports when she got home she started to feel ill again.     Physical Exam   Triage Vital Signs: ED Triage Vitals  Enc Vitals Group     BP 02/04/22 1850 (!) 133/109     Pulse Rate 02/04/22 1850 (!) 105     Resp 02/04/22 1850 19     Temp 02/04/22 1850 98.8 F (37.1 C)     Temp src --      SpO2 02/04/22 1850 99 %     Weight 02/04/22 1853 68 kg (149 lb 14.6 oz)     Height 02/04/22 1858 1.626 m (5\' 4" )     Head Circumference --      Peak Flow --      Pain Score 02/04/22 1853 8     Pain Loc --      Pain Edu? --      Excl. in GC? --     Most recent vital signs: Vitals:   02/04/22 2030 02/04/22 2151  BP: (!) 144/81 (!) 155/90  Pulse: 76 (!) 101  Resp: (!) 22 14  Temp:    SpO2: 97% 98%     General: Awake, no distress.  CV:  Good peripheral perfusion.  Resp:  Normal effort.  Abd:  No distention.  Other:     ED Results / Procedures / Treatments   Labs (all labs ordered are listed, but only abnormal results are displayed) Labs Reviewed  COMPREHENSIVE METABOLIC PANEL - Abnormal; Notable for the following components:      Result Value   CO2 18 (*)    Creatinine, Ser <0.30 (*)    Calcium 8.6 (*)    Total Bilirubin 1.8 (*)    All other components within normal limits  CBC - Abnormal; Notable for the following components:   WBC 15.0 (*)    All other components within  normal limits  LIPASE, BLOOD     EKG  ED ECG REPORT I, 04/06/22, the attending physician, personally viewed and interpreted this ECG.  Date: 02/04/2022  Rhythm: normal sinus rhythm QRS Axis: normal Intervals: normal, normal QTc ST/T Wave abnormalities: normal Narrative Interpretation: no evidence of acute ischemia    RADIOLOGY     PROCEDURES:  Critical Care performed:   Procedures   MEDICATIONS ORDERED IN ED: Medications  droperidol (INAPSINE) 2.5 MG/ML injection 2.5 mg (2.5 mg Intravenous Given 02/04/22 2120)     IMPRESSION / MDM / ASSESSMENT AND PLAN / ED COURSE  I reviewed the triage vital signs and the nursing notes. Patient's presentation is most consistent with severe exacerbation of chronic illness.  Patient presents with nausea and vomiting as detailed above  Differential includes nausea vomiting related to cannabis hyperemesis, this appears to be the prevailing theory, other cyclical vomiting syndrome.  Patient had CT abdomen pelvis recently  which was unremarkable  We will obtain EKG,  place IV, check labs, treat with IV droperidol and reevaluate.  Verified normal QTc and treated with IV droperidol with only mild improvement  Patient with continued nausea and vomiting White blood cell count is mildly elevated but lab work is overall unrevealing, normal BMP  Will discuss with hospitalist for readmission      FINAL CLINICAL IMPRESSION(S) / ED DIAGNOSES   Final diagnoses:  Intractable nausea and vomiting     Rx / DC Orders   ED Discharge Orders     None        Note:  This document was prepared using Dragon voice recognition software and may include unintentional dictation errors.   Jene Every, MD 02/04/22 2239

## 2022-02-04 NOTE — Assessment & Plan Note (Signed)
Mildly elevated total bilirubin with normal lipase Denies abdominal pain Acute cholecystitis not suspected at this time Other nonacute CT abdomen and pelvis on 6/7 and was evaluated by surgery Continue to monitor and can get CT if evolving symptoms

## 2022-02-04 NOTE — Assessment & Plan Note (Signed)
Patient does self catheterizations at least 4 times a day We will continue that during this hospitalization

## 2022-02-04 NOTE — Progress Notes (Signed)
Patient is requesting to speak with the attending today regarding gall bladder.
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
## 2022-02-04 NOTE — Assessment & Plan Note (Signed)
Unclear etiology Concern for possible cannabinoid hyperemesis syndrome Continue antiemetics, IV fluids, IV PPI Keep patient n.p.o. for now

## 2022-02-04 NOTE — Assessment & Plan Note (Signed)
Imaging on 01/31/22 showed findings concerning for chronic osteomyelitis.  these findings are not new, dating back at least a year ago. Patient had been evaluated by orthopedic surgery on 01/31/22 and no need for any intervention at this time
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
## 2022-02-05 ENCOUNTER — Encounter: Payer: Self-pay | Admitting: Internal Medicine

## 2022-02-05 ENCOUNTER — Other Ambulatory Visit: Payer: Self-pay

## 2022-02-05 DIAGNOSIS — M62838 Other muscle spasm: Secondary | ICD-10-CM

## 2022-02-05 DIAGNOSIS — R112 Nausea with vomiting, unspecified: Secondary | ICD-10-CM | POA: Diagnosis not present

## 2022-02-05 LAB — CBC
HCT: 45.5 % (ref 36.0–46.0)
Hemoglobin: 15.4 g/dL — ABNORMAL HIGH (ref 12.0–15.0)
MCH: 31.3 pg (ref 26.0–34.0)
MCHC: 33.8 g/dL (ref 30.0–36.0)
MCV: 92.5 fL (ref 80.0–100.0)
Platelets: 150 10*3/uL (ref 150–400)
RBC: 4.92 MIL/uL (ref 3.87–5.11)
RDW: 13.1 % (ref 11.5–15.5)
WBC: 12.5 10*3/uL — ABNORMAL HIGH (ref 4.0–10.5)
nRBC: 0 % (ref 0.0–0.2)

## 2022-02-05 LAB — BASIC METABOLIC PANEL
Anion gap: 13 (ref 5–15)
BUN: <5 mg/dL — ABNORMAL LOW (ref 6–20)
CO2: 20 mmol/L — ABNORMAL LOW (ref 22–32)
Calcium: 8.9 mg/dL (ref 8.9–10.3)
Chloride: 105 mmol/L (ref 98–111)
Creatinine, Ser: <0.3 mg/dL — ABNORMAL LOW (ref 0.44–1.00)
Glucose, Bld: 77 mg/dL (ref 70–99)
Potassium: 4.3 mmol/L (ref 3.5–5.1)
Sodium: 138 mmol/L (ref 135–145)

## 2022-02-05 LAB — URINE DRUG SCREEN, QUALITATIVE (ARMC ONLY)
Amphetamines, Ur Screen: NOT DETECTED
Barbiturates, Ur Screen: NOT DETECTED
Benzodiazepine, Ur Scrn: NOT DETECTED
Cannabinoid 50 Ng, Ur ~~LOC~~: POSITIVE — AB
Cocaine Metabolite,Ur ~~LOC~~: NOT DETECTED
MDMA (Ecstasy)Ur Screen: NOT DETECTED
Methadone Scn, Ur: NOT DETECTED
Opiate, Ur Screen: NOT DETECTED
Phencyclidine (PCP) Ur S: NOT DETECTED
Tricyclic, Ur Screen: POSITIVE — AB

## 2022-02-05 LAB — CULTURE, BLOOD (ROUTINE X 2): Culture: NO GROWTH

## 2022-02-05 MED ORDER — KETOROLAC TROMETHAMINE 15 MG/ML IJ SOLN
15.0000 mg | Freq: Three times a day (TID) | INTRAMUSCULAR | Status: AC | PRN
  Administered 2022-02-06: 15 mg via INTRAVENOUS
  Filled 2022-02-05 (×2): qty 1

## 2022-02-05 MED ORDER — DROPERIDOL 2.5 MG/ML IJ SOLN: 1.2500 mg | Freq: Three times a day (TID) | INTRAMUSCULAR | Status: DC | PRN

## 2022-02-05 MED ORDER — METHOCARBAMOL 1000 MG/10ML IJ SOLN
500.0000 mg | Freq: Three times a day (TID) | INTRAVENOUS | Status: DC
  Administered 2022-02-05 – 2022-02-07 (×6): 500 mg via INTRAVENOUS
  Filled 2022-02-05: qty 5
  Filled 2022-02-05: qty 500
  Filled 2022-02-05 (×4): qty 5
  Filled 2022-02-05: qty 500

## 2022-02-05 MED ORDER — KETOROLAC TROMETHAMINE 15 MG/ML IJ SOLN
15.0000 mg | Freq: Once | INTRAMUSCULAR | Status: AC
  Administered 2022-02-05: 15 mg via INTRAVENOUS
  Filled 2022-02-05: qty 1

## 2022-02-05 MED ORDER — PROCHLORPERAZINE EDISYLATE 10 MG/2ML IJ SOLN
10.0000 mg | Freq: Four times a day (QID) | INTRAMUSCULAR | Status: DC | PRN
  Administered 2022-02-05: 10 mg via INTRAVENOUS
  Filled 2022-02-05: qty 2

## 2022-02-05 MED ORDER — HALOPERIDOL LACTATE 5 MG/ML IJ SOLN
2.0000 mg | Freq: Four times a day (QID) | INTRAMUSCULAR | Status: DC | PRN
  Administered 2022-02-05 (×2): 2 mg via INTRAVENOUS
  Filled 2022-02-05 (×2): qty 1

## 2022-02-05 NOTE — Assessment & Plan Note (Addendum)
Chronic left hip and back pain --pt complained of muscle spasm on current presentation.   --chronic pain. No current opioids Rx.  Pt repeatedly asking for IV opioids.  When I suggested establishing with pain clinic, pt reported she had tried but had difficulty establishing with pain specialist, but later pt also said she didn't want to take opioids pain meds at home around her children.  But pt wants it when she is in the hospital. Plan: --IV toradol PRN --IV robaxin scheduled --will not order IV opioids
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
## 2022-02-06 DIAGNOSIS — R112 Nausea with vomiting, unspecified: Secondary | ICD-10-CM | POA: Diagnosis not present

## 2022-02-06 LAB — CULTURE, BLOOD (ROUTINE X 2)
Culture: NO GROWTH
Special Requests: ADEQUATE

## 2022-02-06 LAB — BASIC METABOLIC PANEL
Anion gap: 8 (ref 5–15)
BUN: <5 mg/dL — ABNORMAL LOW (ref 6–20)
CO2: 23 mmol/L (ref 22–32)
Calcium: 8.2 mg/dL — ABNORMAL LOW (ref 8.9–10.3)
Chloride: 108 mmol/L (ref 98–111)
Creatinine, Ser: <0.3 mg/dL — ABNORMAL LOW (ref 0.44–1.00)
Glucose, Bld: 69 mg/dL — ABNORMAL LOW (ref 70–99)
Potassium: 3 mmol/L — ABNORMAL LOW (ref 3.5–5.1)
Sodium: 139 mmol/L (ref 135–145)

## 2022-02-06 LAB — CBC
HCT: 38 % (ref 36.0–46.0)
Hemoglobin: 12.5 g/dL (ref 12.0–15.0)
MCH: 30.6 pg (ref 26.0–34.0)
MCHC: 32.9 g/dL (ref 30.0–36.0)
MCV: 93.1 fL (ref 80.0–100.0)
Platelets: 175 10*3/uL (ref 150–400)
RBC: 4.08 MIL/uL (ref 3.87–5.11)
RDW: 13.2 % (ref 11.5–15.5)
WBC: 10.5 10*3/uL (ref 4.0–10.5)
nRBC: 0 % (ref 0.0–0.2)

## 2022-02-06 LAB — MAGNESIUM: Magnesium: 2 mg/dL (ref 1.7–2.4)

## 2022-02-06 MED ORDER — SODIUM CHLORIDE 0.9 % IV SOLN
INTRAVENOUS | Status: DC
  Filled 2022-02-06 (×2): qty 1000

## 2022-02-06 MED ORDER — BOOST / RESOURCE BREEZE PO LIQD CUSTOM
1.0000 | Freq: Three times a day (TID) | ORAL | Status: DC
  Administered 2022-02-06: 1 via ORAL
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
## 2022-02-06 NOTE — Care Management Obs Status (Signed)
[place] NOTIFICATION   Patient Details  Name: Braniyah Oliveria MRN: [id number] Date of Birth: [birth year]   Medicare Observation Status Notification Given:  Yes    Donnelly Angelica, LCSW 02/06/2022, 10:14 AM

## 2022-02-07 DIAGNOSIS — R112 Nausea with vomiting, unspecified: Secondary | ICD-10-CM | POA: Diagnosis not present

## 2022-02-07 MED ORDER — ONDANSETRON HCL 4 MG PO TABS: 4.0000 mg | ORAL_TABLET | Freq: Four times a day (QID) | ORAL | 0 refills | Status: AC | PRN

## 2022-02-07 NOTE — TOC CM/SW Note (Signed)
Pt has orders to discharge today. Pt PCP is [facility]. Chart reviewed. No TOC needs identified. CSW signing off.
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
## 2022-02-17 ENCOUNTER — Inpatient Hospital Stay
Admission: EM | Admit: 2022-02-17 | Discharge: 2022-02-19 | DRG: 392 | Discharge status: Against Medical Advice | Payer: 59 | Attending: Hospitalist | Admitting: Hospitalist

## 2022-02-17 ENCOUNTER — Observation Stay: Payer: 59

## 2022-02-17 ENCOUNTER — Other Ambulatory Visit: Payer: Self-pay

## 2022-02-17 DIAGNOSIS — Z87891 Personal history of nicotine dependence: Secondary | ICD-10-CM

## 2022-02-17 DIAGNOSIS — Z993 Dependence on wheelchair: Secondary | ICD-10-CM

## 2022-02-17 DIAGNOSIS — Z89512 Acquired absence of left leg below knee: Secondary | ICD-10-CM

## 2022-02-17 DIAGNOSIS — D72828 Other elevated white blood cell count: Secondary | ICD-10-CM | POA: Diagnosis present

## 2022-02-17 DIAGNOSIS — K8020 Calculus of gallbladder without cholecystitis without obstruction: Secondary | ICD-10-CM | POA: Diagnosis present

## 2022-02-17 DIAGNOSIS — M869 Osteomyelitis, unspecified: Secondary | ICD-10-CM | POA: Diagnosis present

## 2022-02-17 DIAGNOSIS — Z8249 Family history of ischemic heart disease and other diseases of the circulatory system: Secondary | ICD-10-CM

## 2022-02-17 DIAGNOSIS — K209 Esophagitis, unspecified without bleeding: Secondary | ICD-10-CM | POA: Diagnosis present

## 2022-02-17 DIAGNOSIS — Z89511 Acquired absence of right leg below knee: Secondary | ICD-10-CM

## 2022-02-17 DIAGNOSIS — R112 Nausea with vomiting, unspecified: Secondary | ICD-10-CM | POA: Diagnosis not present

## 2022-02-17 DIAGNOSIS — F1290 Cannabis use, unspecified, uncomplicated: Secondary | ICD-10-CM | POA: Diagnosis present

## 2022-02-17 DIAGNOSIS — G8220 Paraplegia, unspecified: Secondary | ICD-10-CM | POA: Diagnosis present

## 2022-02-17 DIAGNOSIS — Z95828 Presence of other vascular implants and grafts: Secondary | ICD-10-CM

## 2022-02-17 DIAGNOSIS — Z885 Allergy status to narcotic agent status: Secondary | ICD-10-CM

## 2022-02-17 DIAGNOSIS — R1110 Vomiting, unspecified: Secondary | ICD-10-CM | POA: Diagnosis not present

## 2022-02-17 DIAGNOSIS — N319 Neuromuscular dysfunction of bladder, unspecified: Secondary | ICD-10-CM | POA: Diagnosis present

## 2022-02-17 DIAGNOSIS — E876 Hypokalemia: Secondary | ICD-10-CM | POA: Diagnosis present

## 2022-02-17 DIAGNOSIS — Z833 Family history of diabetes mellitus: Secondary | ICD-10-CM

## 2022-02-17 DIAGNOSIS — G8929 Other chronic pain: Secondary | ICD-10-CM | POA: Diagnosis present

## 2022-02-17 DIAGNOSIS — K2970 Gastritis, unspecified, without bleeding: Secondary | ICD-10-CM | POA: Diagnosis present

## 2022-02-17 DIAGNOSIS — F12988 Cannabis use, unspecified with other cannabis-induced disorder: Secondary | ICD-10-CM | POA: Diagnosis present

## 2022-02-17 LAB — COMPREHENSIVE METABOLIC PANEL
ALT: 16 U/L (ref 0–44)
AST: 19 U/L (ref 15–41)
Albumin: 4.4 g/dL (ref 3.5–5.0)
Alkaline Phosphatase: 53 U/L (ref 38–126)
Anion gap: 9 (ref 5–15)
BUN: 13 mg/dL (ref 6–20)
CO2: 16 mmol/L — ABNORMAL LOW (ref 22–32)
Calcium: 9.5 mg/dL (ref 8.9–10.3)
Chloride: 115 mmol/L — ABNORMAL HIGH (ref 98–111)
Creatinine, Ser: <0.3 mg/dL — ABNORMAL LOW (ref 0.44–1.00)
Glucose, Bld: 141 mg/dL — ABNORMAL HIGH (ref 70–99)
Potassium: 3.4 mmol/L — ABNORMAL LOW (ref 3.5–5.1)
Sodium: 140 mmol/L (ref 135–145)
Total Bilirubin: 0.8 mg/dL (ref 0.3–1.2)
Total Protein: 7.9 g/dL (ref 6.5–8.1)

## 2022-02-17 LAB — URINALYSIS, ROUTINE W REFLEX MICROSCOPIC
Bilirubin Urine: NEGATIVE
Glucose, UA: NEGATIVE mg/dL
Hgb urine dipstick: NEGATIVE
Ketones, ur: 80 mg/dL — AB
Leukocytes,Ua: NEGATIVE
Nitrite: NEGATIVE
Protein, ur: NEGATIVE mg/dL
Specific Gravity, Urine: 1.019 (ref 1.005–1.030)
pH: 7 (ref 5.0–8.0)

## 2022-02-17 LAB — CBC WITH DIFFERENTIAL/PLATELET
Abs Immature Granulocytes: 0.09 10*3/uL — ABNORMAL HIGH (ref 0.00–0.07)
Basophils Absolute: 0.1 10*3/uL (ref 0.0–0.1)
Basophils Relative: 0 %
Eosinophils Absolute: 0 10*3/uL (ref 0.0–0.5)
Eosinophils Relative: 0 %
HCT: 45.9 % (ref 36.0–46.0)
Hemoglobin: 15.5 g/dL — ABNORMAL HIGH (ref 12.0–15.0)
Immature Granulocytes: 1 %
Lymphocytes Relative: 12 %
Lymphs Abs: 1.9 10*3/uL (ref 0.7–4.0)
MCH: 30.7 pg (ref 26.0–34.0)
MCHC: 33.8 g/dL (ref 30.0–36.0)
MCV: 90.9 fL (ref 80.0–100.0)
Monocytes Absolute: 0.8 10*3/uL (ref 0.1–1.0)
Monocytes Relative: 5 %
Neutro Abs: 13.1 10*3/uL — ABNORMAL HIGH (ref 1.7–7.7)
Neutrophils Relative %: 82 %
Platelets: 332 10*3/uL (ref 150–400)
RBC: 5.05 MIL/uL (ref 3.87–5.11)
RDW: 13.2 % (ref 11.5–15.5)
WBC: 15.9 10*3/uL — ABNORMAL HIGH (ref 4.0–10.5)
nRBC: 0 % (ref 0.0–0.2)

## 2022-02-17 LAB — URINE DRUG SCREEN, QUALITATIVE (ARMC ONLY)
Amphetamines, Ur Screen: NOT DETECTED
Barbiturates, Ur Screen: NOT DETECTED
Benzodiazepine, Ur Scrn: NOT DETECTED
Cannabinoid 50 Ng, Ur ~~LOC~~: POSITIVE — AB
Cocaine Metabolite,Ur ~~LOC~~: NOT DETECTED
MDMA (Ecstasy)Ur Screen: NOT DETECTED
Methadone Scn, Ur: NOT DETECTED
Opiate, Ur Screen: NOT DETECTED
Phencyclidine (PCP) Ur S: NOT DETECTED
Tricyclic, Ur Screen: NOT DETECTED

## 2022-02-17 LAB — POC URINE PREG, ED: Preg Test, Ur: NEGATIVE

## 2022-02-17 LAB — LIPASE, BLOOD: Lipase: 28 U/L (ref 11–51)

## 2022-02-17 MED ORDER — LACTATED RINGERS IV BOLUS
1000.0000 mL | Freq: Once | INTRAVENOUS | Status: AC
  Administered 2022-02-17: 1000 mL via INTRAVENOUS

## 2022-02-17 MED ORDER — HALOPERIDOL LACTATE 5 MG/ML IJ SOLN
2.0000 mg | Freq: Once | INTRAMUSCULAR | Status: AC
  Administered 2022-02-17: 2 mg via INTRAVENOUS
  Filled 2022-02-17: qty 1

## 2022-02-17 MED ORDER — ONDANSETRON HCL 4 MG/2ML IJ SOLN
4.0000 mg | Freq: Four times a day (QID) | INTRAMUSCULAR | Status: DC | PRN
  Administered 2022-02-17 – 2022-02-19 (×3): 4 mg via INTRAVENOUS
  Filled 2022-02-17 (×3): qty 2

## 2022-02-17 MED ORDER — KETOROLAC TROMETHAMINE 15 MG/ML IJ SOLN
15.0000 mg | Freq: Once | INTRAMUSCULAR | Status: AC
  Administered 2022-02-17: 15 mg via INTRAVENOUS
  Filled 2022-02-17: qty 1

## 2022-02-17 MED ORDER — ONDANSETRON HCL 4 MG PO TABS: 4.0000 mg | ORAL_TABLET | Freq: Four times a day (QID) | ORAL | Status: DC | PRN

## 2022-02-17 MED ORDER — SODIUM CHLORIDE 0.9 % IV SOLN: INTRAVENOUS | Status: DC

## 2022-02-17 MED ORDER — ONDANSETRON HCL 4 MG/2ML IJ SOLN
4.0000 mg | Freq: Once | INTRAMUSCULAR | Status: AC
  Administered 2022-02-17: 4 mg via INTRAVENOUS
  Filled 2022-02-17: qty 2

## 2022-02-17 MED ORDER — METHOCARBAMOL 1000 MG/10ML IJ SOLN
500.0000 mg | Freq: Once | INTRAMUSCULAR | Status: AC
Start: 2022-02-17 — End: 2022-02-18
  Administered 2022-02-18: 500 mg via INTRAVENOUS
  Filled 2022-02-17: qty 5

## 2022-02-17 MED ORDER — DULOXETINE HCL 30 MG PO CPEP
30.0000 mg | ORAL_CAPSULE | Freq: Every day | ORAL | Status: DC
Start: 2022-02-17 — End: 2022-02-19
  Filled 2022-02-17: qty 1

## 2022-02-17 MED ORDER — METHOCARBAMOL 500 MG PO TABS
500.0000 mg | ORAL_TABLET | Freq: Once | ORAL | Status: AC
Start: 2022-02-17 — End: 2022-02-17
  Administered 2022-02-17: 500 mg via ORAL
  Filled 2022-02-17: qty 1

## 2022-02-17 MED ORDER — ACETAMINOPHEN 500 MG PO TABS
1000.0000 mg | ORAL_TABLET | Freq: Once | ORAL | Status: DC
  Filled 2022-02-17: qty 2

## 2022-02-17 MED ORDER — METOCLOPRAMIDE HCL 5 MG/ML IJ SOLN
10.0000 mg | Freq: Once | INTRAMUSCULAR | Status: AC
  Administered 2022-02-17: 10 mg via INTRAVENOUS
  Filled 2022-02-17: qty 2

## 2022-02-17 NOTE — Progress Notes (Signed)
Cross Cover Nurse reported to me patient c/o RUQ abd pain that radiated to back 7/10. Bedside, patient reports her pain is her posterior ribs and adamantly denied abdominal pain.  She refused exam (still had concern for biliary colic given cholelithiasis on prior CT and now low grade temp present Dose of acetaminophen ordered for her pain. Will continue to watch for concerning symptoms
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
## 2022-02-18 DIAGNOSIS — K2970 Gastritis, unspecified, without bleeding: Secondary | ICD-10-CM | POA: Diagnosis present

## 2022-02-18 DIAGNOSIS — N319 Neuromuscular dysfunction of bladder, unspecified: Secondary | ICD-10-CM | POA: Diagnosis present

## 2022-02-18 DIAGNOSIS — R112 Nausea with vomiting, unspecified: Secondary | ICD-10-CM | POA: Diagnosis present

## 2022-02-18 DIAGNOSIS — Z993 Dependence on wheelchair: Secondary | ICD-10-CM | POA: Diagnosis not present

## 2022-02-18 DIAGNOSIS — Z89512 Acquired absence of left leg below knee: Secondary | ICD-10-CM | POA: Diagnosis not present

## 2022-02-18 DIAGNOSIS — Z89511 Acquired absence of right leg below knee: Secondary | ICD-10-CM | POA: Diagnosis not present

## 2022-02-18 DIAGNOSIS — K8020 Calculus of gallbladder without cholecystitis without obstruction: Secondary | ICD-10-CM | POA: Diagnosis present

## 2022-02-18 DIAGNOSIS — G8220 Paraplegia, unspecified: Secondary | ICD-10-CM | POA: Diagnosis present

## 2022-02-18 DIAGNOSIS — Z8249 Family history of ischemic heart disease and other diseases of the circulatory system: Secondary | ICD-10-CM | POA: Diagnosis not present

## 2022-02-18 DIAGNOSIS — E876 Hypokalemia: Secondary | ICD-10-CM | POA: Diagnosis present

## 2022-02-18 DIAGNOSIS — K209 Esophagitis, unspecified without bleeding: Secondary | ICD-10-CM | POA: Diagnosis present

## 2022-02-18 DIAGNOSIS — Z87891 Personal history of nicotine dependence: Secondary | ICD-10-CM | POA: Diagnosis not present

## 2022-02-18 DIAGNOSIS — D72828 Other elevated white blood cell count: Secondary | ICD-10-CM | POA: Diagnosis present

## 2022-02-18 DIAGNOSIS — Z95828 Presence of other vascular implants and grafts: Secondary | ICD-10-CM | POA: Diagnosis not present

## 2022-02-18 DIAGNOSIS — F1290 Cannabis use, unspecified, uncomplicated: Secondary | ICD-10-CM | POA: Diagnosis present

## 2022-02-18 DIAGNOSIS — Z833 Family history of diabetes mellitus: Secondary | ICD-10-CM | POA: Diagnosis not present

## 2022-02-18 DIAGNOSIS — Z885 Allergy status to narcotic agent status: Secondary | ICD-10-CM | POA: Diagnosis not present

## 2022-02-18 DIAGNOSIS — R1110 Vomiting, unspecified: Secondary | ICD-10-CM | POA: Diagnosis not present

## 2022-02-18 DIAGNOSIS — G8929 Other chronic pain: Secondary | ICD-10-CM | POA: Diagnosis present

## 2022-02-18 LAB — COMPREHENSIVE METABOLIC PANEL
ALT: 13 U/L (ref 0–44)
AST: 14 U/L — ABNORMAL LOW (ref 15–41)
Albumin: 3.5 g/dL (ref 3.5–5.0)
Alkaline Phosphatase: 43 U/L (ref 38–126)
Anion gap: 6 (ref 5–15)
BUN: 8 mg/dL (ref 6–20)
CO2: 20 mmol/L — ABNORMAL LOW (ref 22–32)
Calcium: 8.3 mg/dL — ABNORMAL LOW (ref 8.9–10.3)
Chloride: 115 mmol/L — ABNORMAL HIGH (ref 98–111)
Creatinine, Ser: <0.3 mg/dL — ABNORMAL LOW (ref 0.44–1.00)
Glucose, Bld: 117 mg/dL — ABNORMAL HIGH (ref 70–99)
Potassium: 2.9 mmol/L — ABNORMAL LOW (ref 3.5–5.1)
Sodium: 141 mmol/L (ref 135–145)
Total Bilirubin: 0.4 mg/dL (ref 0.3–1.2)
Total Protein: 6.4 g/dL — ABNORMAL LOW (ref 6.5–8.1)

## 2022-02-18 LAB — CBC
HCT: 37.1 % (ref 36.0–46.0)
Hemoglobin: 12.3 g/dL (ref 12.0–15.0)
MCH: 30.8 pg (ref 26.0–34.0)
MCHC: 33.2 g/dL (ref 30.0–36.0)
MCV: 92.8 fL (ref 80.0–100.0)
Platelets: 208 10*3/uL (ref 150–400)
RBC: 4 MIL/uL (ref 3.87–5.11)
RDW: 13.4 % (ref 11.5–15.5)
WBC: 8.4 10*3/uL (ref 4.0–10.5)
nRBC: 0 % (ref 0.0–0.2)

## 2022-02-18 MED ORDER — METHOCARBAMOL 1000 MG/10ML IJ SOLN
500.0000 mg | Freq: Four times a day (QID) | INTRAVENOUS | Status: DC | PRN
  Administered 2022-02-18 – 2022-02-19 (×3): 500 mg via INTRAVENOUS
  Filled 2022-02-18 (×3): qty 454.55

## 2022-02-18 MED ORDER — HALOPERIDOL LACTATE 5 MG/ML IJ SOLN
2.0000 mg | Freq: Four times a day (QID) | INTRAMUSCULAR | Status: DC | PRN
  Administered 2022-02-18: 2 mg via INTRAVENOUS
  Filled 2022-02-18: qty 1

## 2022-02-18 MED ORDER — POTASSIUM CHLORIDE IN NACL 40-0.9 MEQ/L-% IV SOLN
INTRAVENOUS | Status: DC
  Filled 2022-02-18 (×4): qty 1000

## 2022-02-18 MED ORDER — KETOROLAC TROMETHAMINE 30 MG/ML IJ SOLN
15.0000 mg | Freq: Three times a day (TID) | INTRAMUSCULAR | Status: DC | PRN
  Administered 2022-02-18: 15 mg via INTRAVENOUS
  Filled 2022-02-18: qty 1

## 2022-02-18 MED ORDER — DIPHENHYDRAMINE HCL 50 MG/ML IJ SOLN
25.0000 mg | Freq: Once | INTRAMUSCULAR | Status: AC
  Administered 2022-02-18: 25 mg via INTRAVENOUS
  Filled 2022-02-18: qty 1

## 2022-02-18 MED ORDER — HALOPERIDOL LACTATE 5 MG/ML IJ SOLN
2.0000 mg | Freq: Once | INTRAMUSCULAR | Status: AC
  Administered 2022-02-18: 2 mg via INTRAVENOUS
  Filled 2022-02-18: qty 1

## 2022-02-18 MED ORDER — DROPERIDOL 2.5 MG/ML IJ SOLN
2.5000 mg | Freq: Four times a day (QID) | INTRAMUSCULAR | Status: DC | PRN
  Administered 2022-02-18: 2.5 mg via INTRAVENOUS
  Filled 2022-02-18 (×2): qty 1

## 2022-02-18 MED ORDER — LORAZEPAM 2 MG/ML IJ SOLN
1.0000 mg | Freq: Four times a day (QID) | INTRAMUSCULAR | Status: DC | PRN
  Administered 2022-02-18 – 2022-02-19 (×2): 1 mg via INTRAVENOUS
  Filled 2022-02-18 (×4): qty 1

## 2022-02-18 MED ORDER — SODIUM CHLORIDE 0.9 % IV SOLN
INTRAVENOUS | Status: DC
  Filled 2022-02-18: qty 1000

## 2022-02-18 MED ORDER — LORAZEPAM 2 MG/ML IJ SOLN
0.2500 mg | Freq: Once | INTRAMUSCULAR | Status: AC
Start: 2022-02-18 — End: 2022-02-18
  Administered 2022-02-18: 0.25 mg via INTRAVENOUS
  Filled 2022-02-18: qty 1

## 2022-02-18 MED ORDER — ENOXAPARIN SODIUM 40 MG/0.4ML IJ SOSY: 40.0000 mg | PREFILLED_SYRINGE | INTRAMUSCULAR | Status: DC

## 2022-02-18 NOTE — Consult Note (Signed)
[name], MD [facility]  [address], [address] [place], [place] [address] Phone: [phone] Fax : [phone]  Consultation  Referring Provider:     Dr, Fran Lowes Primary Care Physician:  System, Provider Not In Primary Gastroenterologist: Gentry Fitz         Reason for Consultation:     Nausea  Date of Admission:  02/17/2022 Date of Consultation:  02/18/2022         HPI:   Kathryn Ewing is a 36 y.o. female who I am being asked to see for an upper endoscopy at the request of the patient.  The patient has had chronic nausea and vomiting with the patient thought to have hyperemesis due to her marijuana use.  The patient had reported that this was helping her symptoms.  It was also reported that the patient has been to the emergency department 3 times for the same issue recently.  Past Medical History:  Diagnosis Date   Amputee    Amputee, below knee, left (HCC)    Amputee, below knee, right (HCC)    Anemia, iron deficiency 09/05/2016   B12 deficiency 09/05/2016   Osteomyelitis (HCC)    S/P flap graft     Past Surgical History:  Procedure Laterality Date   BACK SURGERY     CESAREAN SECTION     FREE FLAP GRAFT     IR REMOVAL TUN CV CATH W/O FL  08/13/2017    Prior to Admission medications   Medication Sig Start Date End Date Taking? Authorizing Provider  DULoxetine (CYMBALTA) 30 MG capsule Take 1 capsule (30 mg total) by mouth daily. Home med. 02/05/22  Yes Darlin Priestly, MD  Multiple Vitamins-Minerals (MULTIVITAMIN GUMMIES ADULT) CHEW Chew 2 tablets by mouth daily.   Yes [provider]  naproxen (EC NAPROSYN) 500 MG EC tablet Take 500 mg by mouth 2 (two) times daily as needed for pain.   Yes [provider]  ondansetron (ZOFRAN) 4 MG tablet Take 1 tablet (4 mg total) by mouth every 6 (six) hours as needed for nausea. 02/07/22 03/09/22 Yes Tresa Moore, MD    Family History  Problem Relation Age of Onset   Diabetes Mellitus II Other    Diabetes Mellitus II  Mother    Heart disease Mother      Social History   Tobacco Use   Smoking status: Former   Smokeless tobacco: Never  Vaping Use   Vaping Use: Never used  Substance Use Topics   Alcohol use: No   Drug use: Yes    Types: Marijuana    Comment: sometimes     Allergies as of 02/17/2022 - Review Complete 02/17/2022  Allergen Reaction Noted   Tramadol Nausea And Vomiting, Rash, and Other (See Comments) 09/07/2016   Amoxicillin Nausea And Vomiting and Other (See Comments) 08/02/2016   Codeine Nausea And Vomiting 08/02/2016   Hydrocodone Nausea And Vomiting 08/02/2016   Meropenem Nausea And Vomiting 08/02/2016   Metronidazole Nausea And Vomiting 08/02/2016   Oxycodone-acetaminophen Itching 08/02/2016   Piperacillin sod-tazobactam so Nausea And Vomiting 08/02/2016   Promethazine Nausea And Vomiting 08/02/2016   Diazepam Rash 08/02/2016    Review of Systems:    All systems reviewed and negative except where noted in HPI.   Physical Exam:  Vital signs in last 24 hours: Temp:  [98.4 F (36.9 C)-99.8 F (37.7 C)] 99.8 F (37.7 C) (06/25 0749) Pulse Rate:  [67-97] 97 (06/25 0749) Resp:  [10-19] 18 (06/25 0749) BP: (116-143)/(55-105)  132/74 (06/25 0749) SpO2:  [99 %-100 %] 100 % (06/25 0749) Last BM Date : 02/15/22 General:   Pleasant, cooperative in NAD Head:  Normocephalic and atraumatic. Eyes:   No icterus.   Conjunctiva pink. PERRLA. ENeurologic:  Alert and oriented x3;  grossly normal neurologically. Skin:  Intact without significant lesions or rashes. Psych:  Alert and cooperative. Normal affect.  LAB RESULTS: Recent Labs    02/17/22 1345 02/18/22 0545  WBC 15.9* 8.4  HGB 15.5* 12.3  HCT 45.9 37.1  PLT 332 208   BMET Recent Labs    02/17/22 1345 02/18/22 0545  NA 140 141  K 3.4* 2.9*  CL 115* 115*  CO2 16* 20*  GLUCOSE 141* 117*  BUN 13 8  CREATININE <0.30* <0.30*  CALCIUM 9.5 8.3*   LFT Recent Labs    02/18/22 0545  PROT 6.4*  ALBUMIN 3.5   AST 14*  ALT 13  ALKPHOS 43  BILITOT 0.4   PT/INR No results for input(s): "LABPROT", "INR" in the last 72 hours.  STUDIES: US Abdomen Limited RUQ (LIVER/GB)  Result Date: 02/17/2022 CLINICAL DATA:  Nausea and vomiting. EXAM: ULTRASOUND ABDOMEN LIMITED RIGHT UPPER QUADRANT COMPARISON:  CT scan 01/31/2022 FINDINGS: Gallbladder: There is a 2.4 cm echogenic shadowing gallstone but no gallbladder wall thickening, pericholecystic fluid or sonographic Murphy sign to suggest acute cholecystitis. Common bile duct: Diameter: 4.4 mm Liver: Normal echogenicity without focal lesion or biliary dilatation. Portal vein is patent on color Doppler imaging with normal direction of blood flow towards the liver. Other: No ascites. IMPRESSION: 1. Cholelithiasis but no sonographic findings for acute cholecystitis. 2. Unremarkable sonographic appearance of the liver. No biliary dilatation. Electronically Signed   By: Rudie Meyer M.D.   On: 02/17/2022 17:46      Impression / Plan:   Assessment: Principal Problem:   Hyperemesis   Kathryn Ewing is a 36 y.o. y/o female with who I am being consulted on for an upper endoscopy.  The patient has chronic nausea and vomiting with the thought of hyperemesis due to cannabis use.  The patient is not excepting this diagnosis and wants to make sure nothing is being missed and is requesting an EGD.  Plan:  The patient will be set up for an EGD for tomorrow.  The patient has been explained that there are multiple causes of nausea that are not from a GI issue and some that are from a GI issue.  We will be looking at her esophagus stomach and duodenum to look for any cause of a GI issue of her symptoms.  The patient has been told to set expectations that if it is not from her stomach or esophagus then she should entertain that this may be related to another extraintestinal cause of her symptoms.  The patient has been explained the plan and agrees with it.  Thank you for  involving me in the care of this patient.      LOS: 0 days   [name], MD, [facility] 02/18/2022, 1:20 PM,  Pager [phone] 7am-5pm  Check AMION for 5pm -7am coverage and on weekends   Note: This dictation was prepared with Dragon dictation along with smaller phrase technology. Any transcriptional errors that result from this process are unintentional.

## 2022-02-19 ENCOUNTER — Encounter: Payer: Self-pay | Admitting: Hospitalist

## 2022-02-19 ENCOUNTER — Inpatient Hospital Stay: Payer: 59 | Admitting: Anesthesiology

## 2022-02-19 ENCOUNTER — Encounter
Admission: EM | Discharge status: Against Medical Advice | Payer: Self-pay | Source: Home / Self Care | Attending: Hospitalist

## 2022-02-19 DIAGNOSIS — R112 Nausea with vomiting, unspecified: Secondary | ICD-10-CM | POA: Diagnosis not present

## 2022-02-19 DIAGNOSIS — K209 Esophagitis, unspecified without bleeding: Secondary | ICD-10-CM

## 2022-02-19 DIAGNOSIS — R1110 Vomiting, unspecified: Secondary | ICD-10-CM | POA: Diagnosis not present

## 2022-02-19 HISTORY — PX: ESOPHAGOGASTRODUODENOSCOPY (EGD) WITH PROPOFOL: SHX5813

## 2022-02-19 LAB — MAGNESIUM: Magnesium: 2 mg/dL (ref 1.7–2.4)

## 2022-02-19 LAB — BASIC METABOLIC PANEL
Anion gap: 4 — ABNORMAL LOW (ref 5–15)
BUN: 5 mg/dL — ABNORMAL LOW (ref 6–20)
CO2: 20 mmol/L — ABNORMAL LOW (ref 22–32)
Calcium: 8.4 mg/dL — ABNORMAL LOW (ref 8.9–10.3)
Chloride: 116 mmol/L — ABNORMAL HIGH (ref 98–111)
Creatinine, Ser: <0.3 mg/dL — ABNORMAL LOW (ref 0.44–1.00)
Glucose, Bld: 83 mg/dL (ref 70–99)
Potassium: 3.9 mmol/L (ref 3.5–5.1)
Sodium: 140 mmol/L (ref 135–145)

## 2022-02-19 LAB — CBC
HCT: 39.5 % (ref 36.0–46.0)
Hemoglobin: 12.6 g/dL (ref 12.0–15.0)
MCH: 31 pg (ref 26.0–34.0)
MCHC: 31.9 g/dL (ref 30.0–36.0)
MCV: 97.1 fL (ref 80.0–100.0)
Platelets: 173 10*3/uL (ref 150–400)
RBC: 4.07 MIL/uL (ref 3.87–5.11)
RDW: 13.7 % (ref 11.5–15.5)
WBC: 7.3 10*3/uL (ref 4.0–10.5)
nRBC: 0 % (ref 0.0–0.2)

## 2022-02-19 SURGERY — ESOPHAGOGASTRODUODENOSCOPY (EGD) WITH PROPOFOL
Anesthesia: General

## 2022-02-19 MED ORDER — MIDAZOLAM HCL 2 MG/2ML IJ SOLN
INTRAMUSCULAR | Status: AC
  Filled 2022-02-19: qty 2

## 2022-02-19 MED ORDER — CHLORHEXIDINE GLUCONATE CLOTH 2 % EX PADS: 6.0000 | MEDICATED_PAD | Freq: Every day | CUTANEOUS | Status: DC

## 2022-02-19 MED ORDER — LIDOCAINE HCL (CARDIAC) PF 100 MG/5ML IV SOSY
PREFILLED_SYRINGE | INTRAVENOUS | Status: DC | PRN
  Administered 2022-02-19: 50 mg via INTRAVENOUS

## 2022-02-19 MED ORDER — PANTOPRAZOLE SODIUM 40 MG PO TBEC: 40.0000 mg | DELAYED_RELEASE_TABLET | Freq: Two times a day (BID) | ORAL | 2 refills | Status: DC

## 2022-02-19 MED ORDER — PANTOPRAZOLE SODIUM 40 MG IV SOLR
40.0000 mg | Freq: Two times a day (BID) | INTRAVENOUS | Status: DC
Start: 2022-02-19 — End: 2022-02-19
  Administered 2022-02-19: 40 mg via INTRAVENOUS
  Filled 2022-02-19 (×2): qty 10

## 2022-02-19 MED ORDER — PROPOFOL 500 MG/50ML IV EMUL
INTRAVENOUS | Status: DC | PRN
  Administered 2022-02-19: 150 ug/kg/min via INTRAVENOUS

## 2022-02-19 MED ORDER — PROPOFOL 10 MG/ML IV BOLUS
INTRAVENOUS | Status: DC | PRN
  Administered 2022-02-19: 10 mg via INTRAVENOUS
  Administered 2022-02-19: 40 mg via INTRAVENOUS

## 2022-02-19 MED ORDER — MIDAZOLAM HCL 2 MG/2ML IJ SOLN
INTRAMUSCULAR | Status: DC | PRN
  Administered 2022-02-19: 2 mg via INTRAVENOUS

## 2022-02-19 MED ORDER — SODIUM CHLORIDE 0.9 % IV SOLN: INTRAVENOUS | Status: DC | PRN

## 2022-02-19 NOTE — Progress Notes (Addendum)
Patient called this RN into room. Patient states " I need to leave, I have a family emergency". MD made aware. This RN educated patient on the importance of staying until she is medically cleared. Patient reiterated that she understands and that she needs to leave. AMA papers were signed by patient. IV and Foley removed. Left via wheelchair with spouse at 1814.

## 2022-02-19 NOTE — Plan of Care (Signed)

## 2022-02-20 ENCOUNTER — Encounter: Payer: Self-pay | Admitting: Gastroenterology

## 2022-02-20 LAB — SURGICAL PATHOLOGY
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
## 2022-02-22 ENCOUNTER — Encounter: Payer: Self-pay | Admitting: Gastroenterology

## 2022-07-08 ENCOUNTER — Telehealth: Payer: 59 | Admitting: Physician Assistant

## 2022-07-08 DIAGNOSIS — K2100 Gastro-esophageal reflux disease with esophagitis, without bleeding: Secondary | ICD-10-CM | POA: Diagnosis not present

## 2022-07-08 MED ORDER — PANTOPRAZOLE SODIUM 40 MG PO TBEC: 40.0000 mg | DELAYED_RELEASE_TABLET | Freq: Two times a day (BID) | ORAL | 0 refills | Status: AC

## 2022-07-08 NOTE — Patient Instructions (Signed)
  Dorthy Cooler, thank you for joining Margaretann Loveless, PA-C for today's virtual visit.  While this provider is not your primary care provider (PCP), if your PCP is located in our provider database this encounter information will be shared with them immediately following your visit.   A [facility] MyChart account gives you access to today's visit and all your visits, tests, and labs performed at [facility] " click here if you don't have a [facility] MyChart account or go to [email or web address]  Consent: (Patient) Kathryn Ewing provided verbal consent for this virtual visit at the beginning of the encounter.  Current Medications:  Current Outpatient Medications:    DULoxetine (CYMBALTA) 30 MG capsule, Take 1 capsule (30 mg total) by mouth daily. Home med., Disp: , Rfl: 3   Multiple Vitamins-Minerals (MULTIVITAMIN GUMMIES ADULT) CHEW, Chew 2 tablets by mouth daily., Disp: , Rfl:    naproxen (EC NAPROSYN) 500 MG EC tablet, Take 500 mg by mouth 2 (two) times daily as needed for pain., Disp: , Rfl:    pantoprazole (PROTONIX) 40 MG tablet, Take 1 tablet (40 mg total) by mouth 2 (two) times daily. For esophagitis., Disp: 180 tablet, Rfl: 0   Medications ordered in this encounter:  Meds ordered this encounter  Medications   pantoprazole (PROTONIX) 40 MG tablet    Sig: Take 1 tablet (40 mg total) by mouth 2 (two) times daily. For esophagitis.    Dispense:  180 tablet    Refill:  0    Order Specific Question:   Supervising Provider    Answer:   Merrilee Jansky [id number]     *If you need refills on other medications prior to your next appointment, please contact your pharmacy*  Follow-Up: Call back or seek an in-person evaluation if the symptoms worsen or if the condition fails to improve as anticipated.  [facility] [phone]  If you have been instructed to have an in-person evaluation today at a local Urgent Care facility, please use the link below.  It will take you to a list of all of our available [facility], including address, phone number and hours of operation. Please do not delay care.  [facility]  If you or a family member do not have a primary care provider, use the link below to schedule a visit and establish care. When you choose a [facility] primary care physician or advanced practice provider, you gain a long-term partner in health. Find a Primary Care Provider  Learn more about [facility]'s in-office and virtual care options: [place] - Get Care Now

## 2022-07-08 NOTE — Progress Notes (Signed)
Virtual Visit Consent   Kathryn Ewing, you are scheduled for a virtual visit with a [facility] provider today. Just as with appointments in the office, your consent must be obtained to participate. Your consent will be active for this visit and any virtual visit you may have with one of our providers in the next 365 days. If you have a MyChart account, a copy of this consent can be sent to you electronically.  As this is a virtual visit, video technology does not allow for your provider to perform a traditional examination. This may limit your provider's ability to fully assess your condition. If your provider identifies any concerns that need to be evaluated in person or the need to arrange testing (such as labs, EKG, etc.), we will make arrangements to do so. Although advances in technology are sophisticated, we cannot ensure that it will always work on either your end or our end. If the connection with a video visit is poor, the visit may have to be switched to a telephone visit. With either a video or telephone visit, we are not always able to ensure that we have a secure connection.  By engaging in this virtual visit, you consent to the provision of healthcare and authorize for your insurance to be billed (if applicable) for the services provided during this visit. Depending on your insurance coverage, you may receive a charge related to this service.  I need to obtain your verbal consent now. Are you willing to proceed with your visit today? Kathryn Ewing has provided verbal consent on 07/08/2022 for a virtual visit (video or telephone). Margaretann Loveless, PA-C  Date: 07/08/2022 10:29 AM  Virtual Visit via Video Note   I, Margaretann Loveless, connected with  Kathryn Ewing  ([id number], [birth year]) on 07/08/22 at 10:30 AM EST by a video-enabled telemedicine application and verified that I am speaking with the correct person using two identifiers.  Location: Patient: Virtual Visit Location  Patient: Home Provider: Virtual Visit Location Provider: Home Office   I discussed the limitations of evaluation and management by telemedicine and the availability of in person appointments. The patient expressed understanding and agreed to proceed.    History of Present Illness: Kathryn Ewing is a 36 y.o. who identifies as a female who was assigned female at birth, and is being seen today for questions about establishing with a new PCP and also about medication refills and order for at home catheters. She is a bilateral BKA paraplegic (T9 level) with a neurogenic bladder. She normally will self cath but is running low on catheters. She reports her current PCP is 2 hours away and does not do video visits. She is wanting to establish with a closer provider that may offer video visits every so often. She is also needing a refill of her pantoprazole. She asks about pain medications as she does live in chronic pain due to her history. When asked about what type of pain medications, she admits to needing medications stronger then ibuprofen or aleve as she takes these often without relief and they contribute to her reflux symptoms more.    Problems:  Patient Active Problem List   Diagnosis Date Noted   Acute esophagitis    Intractable nausea and vomiting    Hyperemesis 02/17/2022   Muscle spasm 02/05/2022   Leukocytosis 02/04/2022   Hip pain 02/02/2022   Severe anxiety 02/01/2022   Nausea & vomiting 01/31/2022   Cholelithiasis 01/31/2022   Lactic acidosis 01/31/2022  SIRS due to non-infectious process without acute organ dysfunction (HCC) 01/31/2022   Hypokalemia 08/10/2017   Chronic osteomyelitis (HCC)    B12 deficiency 09/05/2016   Anemia, iron deficiency 09/05/2016   Decubitus ulcer of ischial area, left, stage IV (HCC) 09/04/2016   Chronic osteomyelitis of hip (HCC): Subacute on chronic left ischial osteomyelitis 08/02/2016   Tachycardia 08/02/2016   Lower urinary tract infectious  disease 08/02/2016   S/P bilateral BKA (below knee amputation) (HCC)    Neurogenic bladder 05/10/2016   Paraplegia at T9 level (HCC) 11/02/2015   Depression 11/02/2015   Anxiety 11/02/2015   Chronic pain due to trauma 09/29/2014   Anemia of infection and chronic disease 10/22/2012    Allergies:  Allergies  Allergen Reactions   Tramadol Nausea And Vomiting, Rash and Other (See Comments)    Reaction:  Vision changes     Amoxicillin Nausea And Vomiting and Other (See Comments)    Has patient had a PCN reaction causing immediate rash, facial/tongue/throat swelling, SOB or lightheadedness with hypotension: No Has patient had a PCN reaction causing severe rash involving mucus membranes or skin necrosis: No Has patient had a PCN reaction that required hospitalization No Has patient had a PCN reaction occurring within the last 10 years: Yes If all of the above answers are "NO", then may proceed with Cephalosporin use.   Codeine Nausea And Vomiting   Hydrocodone Nausea And Vomiting   Meropenem Nausea And Vomiting   Metronidazole Nausea And Vomiting   Oxycodone-Acetaminophen Itching    Can tolerate morphine   Piperacillin Sod-Tazobactam So Nausea And Vomiting   Promethazine Nausea And Vomiting   Diazepam Rash   Medications:  Current Outpatient Medications:    DULoxetine (CYMBALTA) 30 MG capsule, Take 1 capsule (30 mg total) by mouth daily. Home med., Disp: , Rfl: 3   Multiple Vitamins-Minerals (MULTIVITAMIN GUMMIES ADULT) CHEW, Chew 2 tablets by mouth daily., Disp: , Rfl:    naproxen (EC NAPROSYN) 500 MG EC tablet, Take 500 mg by mouth 2 (two) times daily as needed for pain., Disp: , Rfl:    pantoprazole (PROTONIX) 40 MG tablet, Take 1 tablet (40 mg total) by mouth 2 (two) times daily. For esophagitis., Disp: 180 tablet, Rfl: 0  Observations/Objective: Patient is well-developed, well-nourished in no acute distress.  Resting comfortably at home.  Head is normocephalic, atraumatic.  No  labored breathing.  Speech is clear and coherent with logical content.  Patient is alert and oriented at baseline.    Assessment and Plan: 1. Gastroesophageal reflux disease with esophagitis, unspecified whether hemorrhage - pantoprazole (PROTONIX) 40 MG tablet; Take 1 tablet (40 mg total) by mouth 2 (two) times daily. For esophagitis.  Dispense: 180 tablet; Refill: 0  - Medication refilled - Advised we could not prescribe any controlled medications as this will require a face to face visit - Also discussed that DME equipment could not be ordered virtually, as this too, normally requires a face to face visit for insurance approval - Advised that link to establish PCP will be available in her AVS, also discussed the [facility] department but that there was no timetable on when it would be starting so may be better to establish elsewhere first and to monitor the [facility] website for this to open, then consider transitioning  Follow Up Instructions: I discussed the assessment and treatment plan with the patient. The patient was provided an opportunity to ask questions and all were answered. The patient agreed with the plan and demonstrated an  understanding of the instructions.  A copy of instructions were sent to the patient via MyChart unless otherwise noted below.    The patient was advised to call back or seek an in-person evaluation if the symptoms worsen or if the condition fails to improve as anticipated.  Time:  I spent 11 minutes with the patient via telehealth technology discussing the above problems/concerns.    Margaretann Loveless, PA-C

## 2022-09-20 NOTE — Progress Notes (Deleted)
New patient visit   Patient: Kathryn Ewing   DOB: 29-May-1986   37 y.o. Female  MRN: [id number] Visit Date: 09/21/2022  Today's healthcare provider: Mardene Speak, PA-C   No chief complaint on file.  Subjective    Kathryn Ewing is a 37 y.o. female who presents today as a new patient to establish care.  HPI  ***  Past Medical History:  Diagnosis Date   Amputee    Amputee, below knee, left ([place])    Amputee, below knee, right ([facility])    Anemia, iron deficiency 09/05/2016   B12 deficiency 09/05/2016   Osteomyelitis ([place])    S/P flap graft    Past Surgical History:  Procedure Laterality Date   BACK SURGERY     CESAREAN SECTION     ESOPHAGOGASTRODUODENOSCOPY (EGD) WITH PROPOFOL N/A 02/19/2022   Procedure: ESOPHAGOGASTRODUODENOSCOPY (EGD) WITH PROPOFOL;  Surgeon: Lucilla Lame, MD;  Location: [facility] ENDOSCOPY;  Service: Endoscopy;  Laterality: N/A;   FREE FLAP GRAFT     green field filter      IR REMOVAL TUN CV CATH W/O [facility]  08/13/2017   Family Status  Relation Name Status   Other  (Not Specified)   Mother  (Not Specified)   Family History  Problem Relation Age of Onset   Diabetes Mellitus II Other    Diabetes Mellitus II Mother    Heart disease Mother    Social History   Socioeconomic History   Marital status: Married    Spouse name: Not on file   Number of children: Not on file   Years of education: Not on file   Highest education level: Not on file  Occupational History   Not on file  Tobacco Use   Smoking status: Former   Smokeless tobacco: Never  Vaping Use   Vaping Use: Never used  Substance and Sexual Activity   Alcohol use: No   Drug use: Yes    Types: Marijuana    Comment: sometimes    Sexual activity: Yes    Birth control/protection: Other-see comments    Comment: Tube litigation  Other Topics Concern   Not on file  Social History Narrative   Not on file   Social Determinants of Health   Financial Resource Strain: Not on file  Food Insecurity:  Not on file  Transportation Needs: Not on file  Physical Activity: Not on file  Stress: Not on file  Social Connections: Not on file   Outpatient Medications Prior to Visit  Medication Sig   DULoxetine (CYMBALTA) 30 MG capsule Take 1 capsule (30 mg total) by mouth daily. Home med.   Multiple Vitamins-Minerals (MULTIVITAMIN GUMMIES ADULT) CHEW Chew 2 tablets by mouth daily.   naproxen (EC NAPROSYN) 500 MG EC tablet Take 500 mg by mouth 2 (two) times daily as needed for pain.   pantoprazole (PROTONIX) 40 MG tablet Take 1 tablet (40 mg total) by mouth 2 (two) times daily. For esophagitis.   No facility-administered medications prior to visit.   Allergies  Allergen Reactions   Tramadol Nausea And Vomiting, Rash and Other (See Comments)    Reaction:  Vision changes     Amoxicillin Nausea And Vomiting and Other (See Comments)    Has patient had a PCN reaction causing immediate rash, facial/tongue/throat swelling, SOB or lightheadedness with hypotension: No Has patient had a PCN reaction causing severe rash involving mucus membranes or skin necrosis: No Has patient had a PCN reaction that required hospitalization No Has patient had  a PCN reaction occurring within the last 10 years: Yes If all of the above answers are "NO", then may proceed with Cephalosporin use.   Codeine Nausea And Vomiting   Hydrocodone Nausea And Vomiting   Meropenem Nausea And Vomiting   Metronidazole Nausea And Vomiting   Oxycodone-Acetaminophen Itching    Can tolerate morphine   Piperacillin Sod-Tazobactam So Nausea And Vomiting   Promethazine Nausea And Vomiting   Diazepam Rash     There is no immunization history on file for this patient.  Health Maintenance  Topic Date Due   Medicare Annual Wellness (AWV)  Never done   COVID-19 Vaccine (1) Never done   Hepatitis C Screening  Never done   DTaP/Tdap/Td (1 - Tdap) Never done   PAP SMEAR-Modifier  Never done   INFLUENZA VACCINE  Never done   HIV  Screening  Completed   HPV VACCINES  Aged Out    Patient Care Team: System, Provider Not In as PCP - General Dayle Points, MD as Consulting Physician (Infectious Diseases)  Review of Systems  {Labs  Heme  Chem  Endocrine  Serology  Results Review (optional):23779}   Objective    There were no vitals taken for this visit. {Show previous vital signs (optional):23777}  Physical Exam ***  Depression Screen     No data to display         No results found for any visits on 09/21/22.  Assessment & Plan     ***  No follow-ups on file.     {provider attestation***:1}   Mardene Speak, PA-C  [place] [phone] (phone) [phone] (fax)  [place]

## 2022-09-21 ENCOUNTER — Ambulatory Visit: Payer: 59 | Admitting: Physician Assistant

## 2022-11-12 ENCOUNTER — Emergency Department
Admission: EM | Admit: 2022-11-12 | Discharge: 2022-11-13 | Disposition: A | Discharge status: Home / Self Care | Payer: 59 | Attending: Emergency Medicine | Admitting: Emergency Medicine

## 2022-11-12 DIAGNOSIS — J020 Streptococcal pharyngitis: Secondary | ICD-10-CM | POA: Diagnosis not present

## 2022-11-12 DIAGNOSIS — E876 Hypokalemia: Secondary | ICD-10-CM | POA: Insufficient documentation

## 2022-11-12 DIAGNOSIS — Z1152 Encounter for screening for COVID-19: Secondary | ICD-10-CM | POA: Insufficient documentation

## 2022-11-12 DIAGNOSIS — R509 Fever, unspecified: Secondary | ICD-10-CM | POA: Diagnosis present

## 2022-11-12 LAB — URINALYSIS, ROUTINE W REFLEX MICROSCOPIC
Bilirubin Urine: NEGATIVE
Glucose, UA: NEGATIVE mg/dL
Hgb urine dipstick: NEGATIVE
Ketones, ur: 5 mg/dL — AB
Leukocytes,Ua: NEGATIVE
Nitrite: POSITIVE — AB
Protein, ur: 30 mg/dL — AB
Specific Gravity, Urine: 1.015 (ref 1.005–1.030)
pH: 7 (ref 5.0–8.0)

## 2022-11-12 LAB — COMPREHENSIVE METABOLIC PANEL
ALT: 9 U/L (ref 0–44)
AST: 14 U/L — ABNORMAL LOW (ref 15–41)
Albumin: 3.8 g/dL (ref 3.5–5.0)
Alkaline Phosphatase: 60 U/L (ref 38–126)
Anion gap: 10 (ref 5–15)
BUN: 7 mg/dL (ref 6–20)
CO2: 19 mmol/L — ABNORMAL LOW (ref 22–32)
Calcium: 8.4 mg/dL — ABNORMAL LOW (ref 8.9–10.3)
Chloride: 107 mmol/L (ref 98–111)
Creatinine, Ser: 0.36 mg/dL — ABNORMAL LOW (ref 0.44–1.00)
GFR, Estimated: >60 mL/min (ref >60)
Glucose, Bld: 108 mg/dL — ABNORMAL HIGH (ref 70–99)
Potassium: 3.1 mmol/L — ABNORMAL LOW (ref 3.5–5.1)
Sodium: 136 mmol/L (ref 135–145)
Total Bilirubin: 0.7 mg/dL (ref 0.3–1.2)
Total Protein: 7.4 g/dL (ref 6.5–8.1)

## 2022-11-12 LAB — RESP PANEL BY RT-PCR (RSV, FLU A&B, COVID)  RVPGX2
Influenza A by PCR: NEGATIVE
Influenza B by PCR: NEGATIVE
Resp Syncytial Virus by PCR: NEGATIVE
SARS Coronavirus 2 by RT PCR: NEGATIVE

## 2022-11-12 LAB — CBC WITH DIFFERENTIAL/PLATELET
Abs Immature Granulocytes: 0.09 10*3/uL — ABNORMAL HIGH (ref 0.00–0.07)
Basophils Absolute: 0.1 10*3/uL (ref 0.0–0.1)
Basophils Relative: 0 %
Eosinophils Absolute: 0.1 10*3/uL (ref 0.0–0.5)
Eosinophils Relative: 0 %
HCT: 42.2 % (ref 36.0–46.0)
Hemoglobin: 14 g/dL (ref 12.0–15.0)
Immature Granulocytes: 1 %
Lymphocytes Relative: 10 %
Lymphs Abs: 2 10*3/uL (ref 0.7–4.0)
MCH: 30.1 pg (ref 26.0–34.0)
MCHC: 33.2 g/dL (ref 30.0–36.0)
MCV: 90.8 fL (ref 80.0–100.0)
Monocytes Absolute: 0.8 10*3/uL (ref 0.1–1.0)
Monocytes Relative: 4 %
Neutro Abs: 16.4 10*3/uL — ABNORMAL HIGH (ref 1.7–7.7)
Neutrophils Relative %: 85 %
Platelets: 117 10*3/uL — ABNORMAL LOW (ref 150–400)
RBC: 4.65 MIL/uL (ref 3.87–5.11)
RDW: 14.6 % (ref 11.5–15.5)
Smear Review: NORMAL
WBC: 19.3 10*3/uL — ABNORMAL HIGH (ref 4.0–10.5)
nRBC: 0 % (ref 0.0–0.2)

## 2022-11-12 LAB — GROUP A STREP BY PCR: Group A Strep by PCR: DETECTED — AB

## 2022-11-12 MED ORDER — POTASSIUM CHLORIDE CRYS ER 20 MEQ PO TBCR
40.0000 meq | EXTENDED_RELEASE_TABLET | Freq: Once | ORAL | Status: DC
  Filled 2022-11-12: qty 2

## 2022-11-12 MED ORDER — AZITHROMYCIN 250 MG PO TABS: ORAL_TABLET | ORAL | 0 refills | Status: AC

## 2022-11-12 NOTE — ED Triage Notes (Signed)
To room 4 via [facility] from home with c/o fever and sore throat x 1 day. Tmax at home 104 orally. Took Tylenol apx 630 this evening. Recent exposure to persons with strep.  20G RAC 255ml NS en route

## 2022-11-12 NOTE — ED Notes (Signed)
EMS line in l forearm infiltrated and was removed.

## 2022-11-12 NOTE — Discharge Instructions (Addendum)
Take antibiotic as prescribed.  Return to the ER for worsening symptoms, persistent vomiting, difficulty breathing or other concerns.

## 2022-11-12 NOTE — ED Notes (Signed)
Upreg neg.
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
## 2022-11-13 LAB — PREGNANCY, URINE: Preg Test, Ur: NEGATIVE

## 2022-11-15 ENCOUNTER — Encounter: Payer: Self-pay | Admitting: Emergency Medicine

## 2022-11-15 DIAGNOSIS — E876 Hypokalemia: Secondary | ICD-10-CM | POA: Insufficient documentation

## 2022-11-15 DIAGNOSIS — R112 Nausea with vomiting, unspecified: Secondary | ICD-10-CM | POA: Diagnosis present

## 2022-11-15 DIAGNOSIS — D72829 Elevated white blood cell count, unspecified: Secondary | ICD-10-CM | POA: Diagnosis not present

## 2022-11-15 LAB — COMPREHENSIVE METABOLIC PANEL
ALT: 11 U/L (ref 0–44)
AST: 16 U/L (ref 15–41)
Albumin: 3.6 g/dL (ref 3.5–5.0)
Alkaline Phosphatase: 86 U/L (ref 38–126)
Anion gap: 14 (ref 5–15)
BUN: 9 mg/dL (ref 6–20)
CO2: 19 mmol/L — ABNORMAL LOW (ref 22–32)
Calcium: 8.6 mg/dL — ABNORMAL LOW (ref 8.9–10.3)
Chloride: 106 mmol/L (ref 98–111)
Creatinine, Ser: 0.33 mg/dL — ABNORMAL LOW (ref 0.44–1.00)
GFR, Estimated: >60 mL/min (ref >60)
Glucose, Bld: 132 mg/dL — ABNORMAL HIGH (ref 70–99)
Potassium: 3.2 mmol/L — ABNORMAL LOW (ref 3.5–5.1)
Sodium: 139 mmol/L (ref 135–145)
Total Bilirubin: 0.7 mg/dL (ref 0.3–1.2)
Total Protein: 7.4 g/dL (ref 6.5–8.1)

## 2022-11-15 LAB — LIPASE, BLOOD: Lipase: 31 U/L (ref 11–51)

## 2022-11-15 LAB — CBC WITH DIFFERENTIAL/PLATELET
Abs Immature Granulocytes: 0.11 10*3/uL — ABNORMAL HIGH (ref 0.00–0.07)
Basophils Absolute: 0.1 10*3/uL (ref 0.0–0.1)
Basophils Relative: 0 %
Eosinophils Absolute: 0.1 10*3/uL (ref 0.0–0.5)
Eosinophils Relative: 1 %
HCT: 47.7 % — ABNORMAL HIGH (ref 36.0–46.0)
Hemoglobin: 15.2 g/dL — ABNORMAL HIGH (ref 12.0–15.0)
Immature Granulocytes: 1 %
Lymphocytes Relative: 14 %
Lymphs Abs: 2.1 10*3/uL (ref 0.7–4.0)
MCH: 29.5 pg (ref 26.0–34.0)
MCHC: 31.9 g/dL (ref 30.0–36.0)
MCV: 92.4 fL (ref 80.0–100.0)
Monocytes Absolute: 0.7 10*3/uL (ref 0.1–1.0)
Monocytes Relative: 4 %
Neutro Abs: 12.8 10*3/uL — ABNORMAL HIGH (ref 1.7–7.7)
Neutrophils Relative %: 80 %
Platelets: 295 10*3/uL (ref 150–400)
RBC: 5.16 MIL/uL — ABNORMAL HIGH (ref 3.87–5.11)
RDW: 14.6 % (ref 11.5–15.5)
WBC: 15.9 10*3/uL — ABNORMAL HIGH (ref 4.0–10.5)
nRBC: 0 % (ref 0.0–0.2)

## 2022-11-15 MED ORDER — ONDANSETRON 4 MG PO TBDP
4.0000 mg | ORAL_TABLET | Freq: Once | ORAL | Status: DC
  Filled 2022-11-15: qty 1

## 2022-11-15 NOTE — ED Triage Notes (Signed)
Pt presents from home via [facility] for N/V that started after taking Cephalexin for strep throat. Pt believes she's allergic to the medication but she picked it up anyway and took two doses. She note getting sick after the second dose. A&Ox4 at this time. Denies CP or SOB.

## 2022-11-16 ENCOUNTER — Emergency Department
Admission: EM | Admit: 2022-11-16 | Discharge: 2022-11-16 | Disposition: A | Discharge status: Home / Self Care | Payer: 59 | Attending: Emergency Medicine | Admitting: Emergency Medicine

## 2022-11-16 DIAGNOSIS — R112 Nausea with vomiting, unspecified: Secondary | ICD-10-CM | POA: Diagnosis not present

## 2022-11-16 LAB — URINALYSIS, ROUTINE W REFLEX MICROSCOPIC
Bacteria, UA: NONE SEEN
Glucose, UA: NEGATIVE mg/dL
Hgb urine dipstick: NEGATIVE
Leukocytes,Ua: NEGATIVE
Nitrite: NEGATIVE
Specific Gravity, Urine: 1.02 (ref 1.005–1.030)
pH: 7 (ref 5.0–8.0)

## 2022-11-16 LAB — POC URINE PREG, ED: Preg Test, Ur: NEGATIVE

## 2022-11-16 MED ORDER — LACTATED RINGERS IV BOLUS
1000.0000 mL | Freq: Once | INTRAVENOUS | Status: AC
  Administered 2022-11-16: 1000 mL via INTRAVENOUS

## 2022-11-16 MED ORDER — ONDANSETRON HCL 4 MG/2ML IJ SOLN
4.0000 mg | INTRAMUSCULAR | Status: AC
  Administered 2022-11-16: 4 mg via INTRAVENOUS
  Filled 2022-11-16: qty 2

## 2022-11-16 MED ORDER — DROPERIDOL 2.5 MG/ML IJ SOLN
1.2500 mg | Freq: Once | INTRAMUSCULAR | Status: AC
  Administered 2022-11-16: 1.25 mg via INTRAVENOUS
  Filled 2022-11-16: qty 2

## 2022-11-16 MED ORDER — KETOROLAC TROMETHAMINE 30 MG/ML IJ SOLN
15.0000 mg | Freq: Once | INTRAMUSCULAR | Status: AC
  Administered 2022-11-16: 15 mg via INTRAVENOUS
  Filled 2022-11-16: qty 1
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
## 2022-11-16 NOTE — ED Notes (Addendum)
Pt stating she wants to leave and go home. MD Tamala Julian made aware. Pt on phone with spouse to come pick her up. Pt also requested for IV to be stopped.

## 2022-11-16 NOTE — ED Notes (Signed)
Pt performed self I&o cath. Urine obtained

## 2023-06-27 ENCOUNTER — Emergency Department
Admission: EM | Admit: 2023-06-27 | Discharge: 2023-06-27 | Disposition: A | Discharge status: Home / Self Care | Payer: 59 | Attending: Emergency Medicine | Admitting: Emergency Medicine

## 2023-06-27 ENCOUNTER — Encounter: Payer: Self-pay | Admitting: Medical Oncology

## 2023-06-27 ENCOUNTER — Other Ambulatory Visit: Payer: Self-pay

## 2023-06-27 DIAGNOSIS — W268XXA Contact with other sharp object(s), not elsewhere classified, initial encounter: Secondary | ICD-10-CM | POA: Diagnosis not present

## 2023-06-27 DIAGNOSIS — Z23 Encounter for immunization: Secondary | ICD-10-CM | POA: Insufficient documentation

## 2023-06-27 DIAGNOSIS — S61213A Laceration without foreign body of left middle finger without damage to nail, initial encounter: Secondary | ICD-10-CM | POA: Insufficient documentation

## 2023-06-27 DIAGNOSIS — Y93G3 Activity, cooking and baking: Secondary | ICD-10-CM | POA: Insufficient documentation

## 2023-06-27 DIAGNOSIS — S61313A Laceration without foreign body of left middle finger with damage to nail, initial encounter: Secondary | ICD-10-CM

## 2023-06-27 MED ORDER — IBUPROFEN 800 MG PO TABS
800.0000 mg | ORAL_TABLET | Freq: Once | ORAL | Status: AC
  Administered 2023-06-27: 800 mg via ORAL
  Filled 2023-06-27: qty 1

## 2023-06-27 MED ORDER — BACITRACIN ZINC 500 UNIT/GM EX OINT
TOPICAL_OINTMENT | Freq: Two times a day (BID) | CUTANEOUS | Status: DC
  Administered 2023-06-27: 1 via TOPICAL
  Filled 2023-06-27: qty 0.9

## 2023-06-27 MED ORDER — TETANUS-DIPHTH-ACELL PERTUSSIS 5-2.5-18.5 LF-MCG/0.5 IM SUSY
0.5000 mL | PREFILLED_SYRINGE | Freq: Once | INTRAMUSCULAR | Status: AC
  Administered 2023-06-27: 0.5 mL via INTRAMUSCULAR
  Filled 2023-06-27: qty 0.5

## 2023-06-27 MED ORDER — MORPHINE SULFATE (PF) 4 MG/ML IV SOLN
4.0000 mg | Freq: Once | INTRAVENOUS | Status: AC
  Administered 2023-06-27: 4 mg via INTRAMUSCULAR
  Filled 2023-06-27: qty 1

## 2023-06-27 MED ORDER — LIDOCAINE HCL (PF) 1 % IJ SOLN
5.0000 mL | Freq: Once | INTRAMUSCULAR | Status: AC
  Administered 2023-06-27: 5 mL via INTRADERMAL
  Filled 2023-06-27: qty 5
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
## 2023-06-27 NOTE — Discharge Instructions (Addendum)
Wash the wound with soap and water daily. Apply triple antibiotic ointment to the wound and cover with a bandage until healed.  You can take 650 mg of Tylenol and 600 mg of ibuprofen every 6 hours as needed for pain.  Watch for signs of infection including redness, warmth, swelling, pain and pus drainage.  If you develop any of these please return to the ED, urgent care or your primary care provider.

## 2023-06-27 NOTE — ED Notes (Signed)
Pt transferred from wheelchair to the bed with 3 person assist using draw sheet. Pt reports her L hip hit the bar on the side of the bed which caused some pain. EDP notified.

## 2023-06-27 NOTE — ED Triage Notes (Signed)
Pt here via EMS with reports that she was cutting food and cut tip of left hand middle finger. UNK last tetanus

## 2023-06-27 NOTE — ED Notes (Signed)
Patient given discharge instructions and discharged with EMS back home.

## 2023-06-27 NOTE — ED Notes (Signed)
Called [facility] spoke with rep Pam, rep.stated she would put on the list to be transported.

## 2024-06-30 ENCOUNTER — Observation Stay
Admission: EM | Admit: 2024-06-30 | Discharge: 2024-07-01 | Disposition: A | Attending: Emergency Medicine | Admitting: Emergency Medicine

## 2024-06-30 DIAGNOSIS — K219 Gastro-esophageal reflux disease without esophagitis: Secondary | ICD-10-CM | POA: Insufficient documentation

## 2024-06-30 DIAGNOSIS — N390 Urinary tract infection, site not specified: Secondary | ICD-10-CM | POA: Diagnosis not present

## 2024-06-30 DIAGNOSIS — R112 Nausea with vomiting, unspecified: Principal | ICD-10-CM | POA: Diagnosis present

## 2024-06-30 DIAGNOSIS — Z7901 Long term (current) use of anticoagulants: Secondary | ICD-10-CM | POA: Insufficient documentation

## 2024-06-30 DIAGNOSIS — Z87891 Personal history of nicotine dependence: Secondary | ICD-10-CM | POA: Insufficient documentation

## 2024-06-30 LAB — CBC
HCT: 42.4 % (ref 36.0–46.0)
Hemoglobin: 14.6 g/dL (ref 12.0–15.0)
MCH: 31.3 pg (ref 26.0–34.0)
MCHC: 34.4 g/dL (ref 30.0–36.0)
MCV: 91 fL (ref 80.0–100.0)
Platelets: 273 K/uL (ref 150–400)
RBC: 4.66 MIL/uL (ref 3.87–5.11)
RDW: 13.6 % (ref 11.5–15.5)
WBC: 13.8 K/uL — ABNORMAL HIGH (ref 4.0–10.5)
nRBC: 0 % (ref 0.0–0.2)

## 2024-06-30 LAB — URINALYSIS, ROUTINE W REFLEX MICROSCOPIC
Bilirubin Urine: NEGATIVE
Glucose, UA: NEGATIVE mg/dL
Hgb urine dipstick: NEGATIVE
Ketones, ur: 80 mg/dL — AB
Nitrite: POSITIVE — AB
Protein, ur: 100 mg/dL — AB
Specific Gravity, Urine: 1.027 (ref 1.005–1.030)
pH: 6 (ref 5.0–8.0)

## 2024-06-30 LAB — COMPREHENSIVE METABOLIC PANEL WITH GFR
ALT: 13 U/L (ref 0–44)
AST: 18 U/L (ref 15–41)
Albumin: 4.3 g/dL (ref 3.5–5.0)
Alkaline Phosphatase: 53 U/L (ref 38–126)
Anion gap: 11 (ref 5–15)
BUN: 12 mg/dL (ref 6–20)
CO2: 18 mmol/L — ABNORMAL LOW (ref 22–32)
Calcium: 8.7 mg/dL — ABNORMAL LOW (ref 8.9–10.3)
Chloride: 108 mmol/L (ref 98–111)
Creatinine, Ser: <0.3 mg/dL — ABNORMAL LOW (ref 0.44–1.00)
Glucose, Bld: 138 mg/dL — ABNORMAL HIGH (ref 70–99)
Potassium: 3.6 mmol/L (ref 3.5–5.1)
Sodium: 137 mmol/L (ref 135–145)
Total Bilirubin: 0.8 mg/dL (ref 0.0–1.2)
Total Protein: 8.5 g/dL — ABNORMAL HIGH (ref 6.5–8.1)

## 2024-06-30 LAB — HCG, QUANTITATIVE, PREGNANCY: hCG, Beta Chain, Quant, S: <1 m[IU]/mL (ref <5)

## 2024-06-30 LAB — LIPASE, BLOOD: Lipase: 25 U/L (ref 11–51)

## 2024-06-30 MED ORDER — SODIUM CHLORIDE 0.9 % IV SOLN
1.0000 g | Freq: Once | INTRAVENOUS | Status: AC
  Administered 2024-06-30: 1 g via INTRAVENOUS
  Filled 2024-06-30: qty 10

## 2024-06-30 MED ORDER — MORPHINE SULFATE (PF) 4 MG/ML IV SOLN
4.0000 mg | Freq: Once | INTRAVENOUS | Status: AC
  Administered 2024-06-30: 4 mg via INTRAVENOUS
  Filled 2024-06-30: qty 1

## 2024-06-30 MED ORDER — LACTATED RINGERS IV BOLUS
1000.0000 mL | Freq: Once | INTRAVENOUS | Status: AC
  Administered 2024-06-30: 1000 mL via INTRAVENOUS

## 2024-06-30 MED ORDER — PROCHLORPERAZINE EDISYLATE 10 MG/2ML IJ SOLN
10.0000 mg | Freq: Once | INTRAMUSCULAR | Status: AC
  Administered 2024-06-30: 10 mg via INTRAVENOUS
  Filled 2024-06-30: qty 2

## 2024-06-30 MED ORDER — DROPERIDOL 2.5 MG/ML IJ SOLN
2.5000 mg | Freq: Once | INTRAMUSCULAR | Status: AC
  Administered 2024-06-30: 2.5 mg via INTRAVENOUS
  Filled 2024-06-30: qty 2

## 2024-06-30 NOTE — ED Triage Notes (Signed)
 Pt presents to the ED via [facility] from home with N/V x3 days. Pt has a hx of acid reflux and has been out of her medicine x3 days and that's when this all started. Pt is a bilateral amputee   22g left hand 4mg  zofran  given by EMS CBG 132 99.9 oral 113/47 HR 68 100% RA

## 2024-06-30 NOTE — ED Provider Notes (Signed)
 [facility] Provider Note    Event Date/Time   First MD Initiated Contact with Patient 06/30/24 1800     (approximate)   History   Chief Complaint Emesis   HPI  Kathryn Ewing is a 38 y.o. female with past medical history of T9 paraplegia status post bilateral BKA, anemia, and cannabinoid hyperemesis who presents to the ED complaining of nausea and vomiting.  Patient reports that she has had 3 days of persistent nausea and vomiting as well as pain across her entire abdomen when she goes to vomit.  She denies any associated diarrhea, describes symptoms as similar to prior episodes of nausea and vomiting.  She had multiple admissions to the hospital in 2023 for cannabinoid hyperemesis, but today states that she does not think it is due to marijuana.  She states that she ran out of her reflux medication 3 days ago prior to the onset of symptoms.  She denies any fevers, dysuria, or flank pain.     Physical Exam   Triage Vital Signs: ED Triage Vitals  Encounter Vitals Group     BP 06/30/24 1711 115/60     Girls Systolic BP Percentile --      Girls Diastolic BP Percentile --      Boys Systolic BP Percentile --      Boys Diastolic BP Percentile --      Pulse Rate 06/30/24 1711 69     Resp 06/30/24 1711 18     Temp 06/30/24 1711 99.4 F (37.4 C)     Temp Source 06/30/24 1711 Oral     SpO2 06/30/24 1711 97 %     Weight 06/30/24 1712 185 lb (83.9 kg)     Height 06/30/24 1712 5' 4 (1.626 m)     Head Circumference --      Peak Flow --      Pain Score 06/30/24 1712 8     Pain Loc --      Pain Education --      Exclude from Growth Chart --     Most recent vital signs: Vitals:   06/30/24 2000 06/30/24 2237  BP: 110/62   Pulse: 67   Resp: 16   Temp:  98.8 F (37.1 C)  SpO2: 100%     Constitutional: Alert and oriented. Eyes: Conjunctivae are normal. Head: Atraumatic. Nose: No congestion/rhinnorhea. Mouth/Throat: Mucous membranes are moist.   Cardiovascular: Normal rate, regular rhythm. Grossly normal heart sounds.  2+ radial pulses bilaterally. Respiratory: Normal respiratory effort.  No retractions. Lungs CTAB. Gastrointestinal: Soft and nontender. No distention. Musculoskeletal: Status post bilateral BKA. Neurologic:  Normal speech and language. No gross focal neurologic deficits are appreciated.    ED Results / Procedures / Treatments   Labs (all labs ordered are listed, but only abnormal results are displayed) Labs Reviewed  COMPREHENSIVE METABOLIC PANEL WITH GFR - Abnormal; Notable for the following components:      Result Value   CO2 18 (*)    Glucose, Bld 138 (*)    Creatinine, Ser <0.30 (*)    Calcium 8.7 (*)    Total Protein 8.5 (*)    All other components within normal limits  CBC - Abnormal; Notable for the following components:   WBC 13.8 (*)    All other components within normal limits  URINALYSIS, ROUTINE W REFLEX MICROSCOPIC - Abnormal; Notable for the following components:   Color, Urine AMBER (*)    APPearance CLOUDY (*)    Ketones,  ur 80 (*)    Protein, ur 100 (*)    Nitrite POSITIVE (*)    Leukocytes,Ua TRACE (*)    Bacteria, UA MANY (*)    All other components within normal limits  URINE CULTURE  LIPASE, BLOOD  HCG, QUANTITATIVE, PREGNANCY  POC URINE PREG, ED     EKG  ED ECG REPORT I, Carlin Palin, the attending physician, personally viewed and interpreted this ECG.   Date: 06/30/2024  EKG Time: 20:36  Rate: 74  Rhythm: normal sinus rhythm  Axis: Normal  Intervals:none  ST&T Change: None  PROCEDURES:  Critical Care performed: No  Procedures   MEDICATIONS ORDERED IN ED: Medications  cefTRIAXone  (ROCEPHIN ) 1 g in sodium chloride  0.9 % 100 mL IVPB (has no administration in time range)  morphine  (PF) 4 MG/ML injection 4 mg (4 mg Intravenous Given 06/30/24 2013)  prochlorperazine  (COMPAZINE ) injection 10 mg (10 mg Intravenous Given 06/30/24 2011)  lactated ringers  bolus  1,000 mL (1,000 mLs Intravenous New Bag/Given 06/30/24 2040)  droperidol  (INAPSINE ) 2.5 MG/ML injection 2.5 mg (2.5 mg Intravenous Given 06/30/24 2232)     IMPRESSION / MDM / ASSESSMENT AND PLAN / ED COURSE  I reviewed the triage vital signs and the nursing notes.                              38 y.o. female with past medical history of T9 paraplegia, bilateral BKA, anemia, and chronic osteomyelitis who presents to the ED complaining of 3 days of persistent nausea and vomiting.  Patient's presentation is most consistent with acute presentation with potential threat to life or bodily function.  Differential diagnosis includes, but is not limited to, pancreatitis, cholecystitis, biliary colic, hepatitis, dehydration, electrolyte abnormality, AKI, cannabinoid hyperemesis, UTI, kidney stone, appendicitis.  Patient nontoxic-appearing and in no acute distress, vital signs are unremarkable.  She has a benign abdominal exam and describes symptoms as similar to prior episodes of nausea and vomiting, do not feel CT imaging indicated at this time.  She became upset when questioned about marijuana, states she does not think this is related to that but does not answer when her last use was.  Labs with mild leukocytosis but no significant anemia, electrolyte abnormality, or AKI.  LFTs and lipase are unremarkable, urinalysis pending.  We will treat with IV Compazine  and morphine , hydrate with IV fluids and reassess.  Patient continues to feel nauseous with vomiting despite Compazine  and morphine .  She was given IV droperidol  also without significant improvement.  Urinalysis does appear concerning for UTI, will send for culture and treat with Rocephin .  Case discussed with hospitalist for admission.      FINAL CLINICAL IMPRESSION(S) / ED DIAGNOSES   Final diagnoses:  Intractable nausea and vomiting     Rx / DC Orders   ED Discharge Orders     None        Note:  This document was prepared using  Dragon voice recognition software and may include unintentional dictation errors.   Palin Carlin, MD 06/30/24 [phone]

## 2024-06-30 NOTE — ED Notes (Signed)
 Pt states that she has a hx of GERD and ran out of her medications 3 days ago. Pt reports N/V x3 days. Pt is A&Ox4 at this time.

## 2024-07-01 ENCOUNTER — Observation Stay

## 2024-07-01 ENCOUNTER — Other Ambulatory Visit: Payer: Self-pay

## 2024-07-01 DIAGNOSIS — K219 Gastro-esophageal reflux disease without esophagitis: Secondary | ICD-10-CM | POA: Insufficient documentation

## 2024-07-01 DIAGNOSIS — N390 Urinary tract infection, site not specified: Secondary | ICD-10-CM

## 2024-07-01 DIAGNOSIS — R112 Nausea with vomiting, unspecified: Secondary | ICD-10-CM | POA: Diagnosis not present

## 2024-07-01 LAB — PROTIME-INR
INR: 1 (ref 0.8–1.2)
Prothrombin Time: 14.2 s (ref 11.4–15.2)

## 2024-07-01 LAB — HIV ANTIBODY (ROUTINE TESTING W REFLEX): HIV Screen 4th Generation wRfx: NONREACTIVE

## 2024-07-01 LAB — CORTISOL-AM, BLOOD: Cortisol - AM: 15.4 ug/dL (ref 6.7–22.6)

## 2024-07-01 MED ORDER — TRAZODONE HCL 50 MG PO TABS
25.0000 mg | ORAL_TABLET | Freq: Every evening | ORAL | Status: DC | PRN
Start: 2024-07-01 — End: 2024-07-01
  Filled 2024-07-01: qty 1

## 2024-07-01 MED ORDER — CEPHALEXIN 500 MG PO CAPS: 500.0000 mg | ORAL_CAPSULE | Freq: Three times a day (TID) | ORAL | 0 refills | Status: AC

## 2024-07-01 MED ORDER — SODIUM CHLORIDE 0.9 % IV SOLN: 2.0000 g | INTRAVENOUS | Status: DC

## 2024-07-01 MED ORDER — ONDANSETRON HCL 4 MG/2ML IJ SOLN
4.0000 mg | INTRAMUSCULAR | Status: DC | PRN
  Administered 2024-07-01: 4 mg via INTRAVENOUS
  Filled 2024-07-01: qty 2

## 2024-07-01 MED ORDER — LACTATED RINGERS IV BOLUS (SEPSIS)
1000.0000 mL | Freq: Once | INTRAVENOUS | Status: AC
  Administered 2024-07-01: 1000 mL via INTRAVENOUS

## 2024-07-01 MED ORDER — KETOROLAC TROMETHAMINE 15 MG/ML IJ SOLN
15.0000 mg | Freq: Four times a day (QID) | INTRAMUSCULAR | Status: DC | PRN
  Administered 2024-07-01: 15 mg via INTRAVENOUS
  Filled 2024-07-01: qty 1

## 2024-07-01 MED ORDER — MORPHINE SULFATE (PF) 2 MG/ML IV SOLN
2.0000 mg | INTRAVENOUS | Status: DC | PRN
  Administered 2024-07-01 (×2): 2 mg via INTRAVENOUS
  Filled 2024-07-01 (×2): qty 1

## 2024-07-01 MED ORDER — LACTATED RINGERS IV SOLN: 150.0000 mL/h | INTRAVENOUS | Status: DC

## 2024-07-01 MED ORDER — ACETAMINOPHEN 325 MG PO TABS: 650.0000 mg | ORAL_TABLET | Freq: Four times a day (QID) | ORAL | Status: DC | PRN

## 2024-07-01 MED ORDER — ACETAMINOPHEN 650 MG RE SUPP
650.0000 mg | Freq: Four times a day (QID) | RECTAL | Status: DC | PRN
Start: 2024-07-01 — End: 2024-07-01

## 2024-07-01 MED ORDER — ENOXAPARIN SODIUM 40 MG/0.4ML IJ SOSY: 0.5000 mg/kg | PREFILLED_SYRINGE | INTRAMUSCULAR | Status: DC

## 2024-07-01 MED ORDER — LACTATED RINGERS IV BOLUS (SEPSIS): 1000.0000 mL | Freq: Once | INTRAVENOUS | Status: DC

## 2024-07-01 MED ORDER — PANTOPRAZOLE SODIUM 40 MG PO TBEC
40.0000 mg | DELAYED_RELEASE_TABLET | Freq: Two times a day (BID) | ORAL | Status: DC
  Administered 2024-07-01: 40 mg via ORAL
  Filled 2024-07-01: qty 1

## 2024-07-01 MED ORDER — ONDANSETRON HCL 4 MG PO TABS: 4.0000 mg | ORAL_TABLET | Freq: Every day | ORAL | 0 refills | Status: AC | PRN

## 2024-07-01 MED ORDER — SODIUM CHLORIDE 0.9 % IV SOLN
1.0000 g | Freq: Once | INTRAVENOUS | Status: AC
  Administered 2024-07-01: 1 g via INTRAVENOUS
  Filled 2024-07-01: qty 10

## 2024-07-01 NOTE — TOC Progression Note (Incomplete)
 Transition of Care [facility]) - Progression Note    Patient Details  Name: Kathryn Ewing MRN: [id number] Date of Birth: September 29, 1985  Transition of Care [facility]) CM/SW Contact  Dalia GORMAN Fuse, RN Phone Number: 07/01/2024, 9:33 AM  Clinical Narrative:                         Expected Discharge Plan and Services                                               Social Drivers of Health (SDOH) Interventions SDOH Screenings   Food Insecurity: Patient Declined (07/01/2024)  Housing: Patient Declined (07/01/2024)  Transportation Needs: Patient Declined (07/01/2024)  Utilities: Patient Declined (07/01/2024)  Financial Resource Strain: Low Risk  (03/21/2020)   Received from [facility]  Social Connections: Patient Declined (07/01/2024)  Tobacco Use: Medium Risk (06/30/2024)    Readmission Risk Interventions     No data to display
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
## 2024-07-01 NOTE — Progress Notes (Signed)
 Anticoagulation monitoring(Lovenox ):  38 yo female ordered Lovenox  40 mg Q24h    Filed Weights   06/30/24 1712  Weight: 83.9 kg (185 lb)   BMI 31.8    Lab Results  Component Value Date   CREATININE <0.30 (L) 06/30/2024   CREATININE 0.33 (L) 11/15/2022   CREATININE 0.36 (L) 11/12/2022   CrCl cannot be calculated (This lab value cannot be used to calculate CrCl because it is not a number: <0.30). Hemoglobin & Hematocrit     Component Value Date/Time   HGB 14.6 06/30/2024 1714   HCT 42.4 06/30/2024 1714     Per Protocol for Patient with estCrcl > 30 ml/min and BMI > 30, will transition to Lovenox  42.5 mg Q24h.

## 2024-07-01 NOTE — Progress Notes (Signed)
 Pt refusing her last Liter bolus of LR, refusing IV fluids at 125. Refusing to go to CT due to it being too early, states she wants to sleep.  Education provided, pt continued to refuse, MD notified.

## 2024-07-01 NOTE — Progress Notes (Incomplete)
 PROGRESS NOTE Kathryn Ewing    DOB: Jan 27, 1986, 38 y.o.  FMW:[id number]    Code Status: Full Code   DOA: 06/30/2024   LOS: 0  Brief hospital course  Kathryn Ewing is a 38 y.o. female with a PMH significant for ***  They presented from *** to the ED on 06/30/2024 with *** x *** days. ***  In the ED, it was found that they had ***.  Significant findings included ***.  They were initially treated with ***.   Patient was admitted to medicine service for further workup and management of *** as outlined in detail below.  07/01/24 -***  Assessment & Plan  Principal Problem:   Intractable nausea and vomiting Active Problems:   Acute UTI   GERD without esophagitis  *** -   *** -   *** -   *** -   *** -   Body mass index is 31.76 kg/m.  VTE ppx: enoxaparin  (LOVENOX ) injection 42.5 mg Start: 07/01/24 0800   Diet:     Diet   Diet clear liquid Room service appropriate? Yes; Fluid consistency: Thin   Consultants: ***  Subjective 07/01/24    Pt reports ***   Objective  Blood pressure (!) 129/102, pulse 83, temperature 98.8 F (37.1 C), temperature source Oral, resp. rate 20, height 5' 4 (1.626 m), weight 83.9 kg, SpO2 97%.  Intake/Output Summary (Last 24 hours) at 07/01/2024 0719 Last data filed at 07/01/2024 0500 Gross per 24 hour  Intake --  Output 500 ml  Net -500 ml   Filed Weights   06/30/24 1712  Weight: 83.9 kg     Physical Exam: *** General: awake, alert, NAD HEENT: atraumatic, clear conjunctiva, anicteric sclera, MMM, hearing grossly normal Respiratory: normal respiratory effort. Cardiovascular: extremities well perfused, quick capillary refill, normal S1/S2, RRR, no JVD, murmurs Gastrointestinal: soft, NT, ND Nervous: A&O x3. no gross focal neurologic deficits, normal speech Extremities: moves all equally, no edema, normal tone Skin: dry, intact, normal temperature, normal color. No rashes, lesions or ulcers on exposed skin Psychiatry: normal  mood, congruent affect  Labs   I have personally reviewed the following labs and imaging studies CBC    Component Value Date/Time   WBC 13.8 (H) 06/30/2024 1714   RBC 4.66 06/30/2024 1714   HGB 14.6 06/30/2024 1714   HCT 42.4 06/30/2024 1714   PLT 273 06/30/2024 1714   MCV 91.0 06/30/2024 1714   MCH 31.3 06/30/2024 1714   MCHC 34.4 06/30/2024 1714   RDW 13.6 06/30/2024 1714   LYMPHSABS 2.1 11/15/2022 2114   MONOABS 0.7 11/15/2022 2114   EOSABS 0.1 11/15/2022 2114   BASOSABS 0.1 11/15/2022 2114      Latest Ref Rng & Units 06/30/2024    5:14 PM 11/15/2022    9:14 PM 11/12/2022   10:48 PM  BMP  Glucose 70 - 99 mg/dL 861  867  891   BUN 6 - 20 mg/dL 12  9  7    Creatinine 0.44 - 1.00 mg/dL <9.69  9.66  9.63   Sodium 135 - 145 mmol/L 137  139  136   Potassium 3.5 - 5.1 mmol/L 3.6  3.2  3.1   Chloride 98 - 111 mmol/L 108  106  107   CO2 22 - 32 mmol/L 18  19  19    Calcium 8.9 - 10.3 mg/dL 8.7  8.6  8.4     No results found.  Disposition Plan & Communication  Patient status: Observation  Admitted From: {  Qmnf:76185} Planned disposition location: {PLAN; DISPOSITION:26386} Anticipated discharge date: *** pending ***  Family Communication: ***    Author: Marien LITTIE Piety, DO [facility] 07/01/2024, 7:19 AM   Available by Epic secure chat 7AM-7PM. If 7PM-7AM, please contact night-coverage.  [facility] contact information found on [email or web address].

## 2024-07-01 NOTE — Assessment & Plan Note (Signed)
-   Will continue IV PPI therapy.

## 2024-07-01 NOTE — Assessment & Plan Note (Signed)
--  This could be related to cannabinoid hyperemesis syndrome though her vomiting started prior to smoking marijuana that she had yesterday to actually try to help with vomiting. - It could be associated with acute pyelonephritis given her UTI and bilateral CVA tenderness. - She will be admitted to a medical telemetry observation bed. - Will continue antibiotic therapy with IV Rocephin  and hydration with IV normal saline. - As needed antiemetics will be provided. - She was counseled for cessation of cannabinoids.

## 2024-07-01 NOTE — Discharge Instructions (Signed)
 Please take your antibiotics until complete.  Follow up with your PCP if not feeling better with the completed treatment
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
## 2024-07-01 NOTE — Assessment & Plan Note (Signed)
-   Her presentation is highly suspicious for pyelonephritis. - Will obtain abdominal and pelvic CT scan. - She will be placed on IV Rocephin . - Will follow blood and urine cultures.

## 2024-07-03 LAB — URINE CULTURE: Culture: >=100000 — AB
